# Patient Record
Sex: Male | Born: 1955 | Race: White | Hispanic: No | Marital: Married | Smoking: Former smoker
Health system: Southern US, Community
[De-identification: ages and names within clinical notes are randomized; demographics above are authoritative.]

## PROBLEM LIST (undated history)

## (undated) DIAGNOSIS — E039 Hypothyroidism, unspecified: Secondary | ICD-10-CM

## (undated) DIAGNOSIS — M109 Gout, unspecified: Secondary | ICD-10-CM

## (undated) DIAGNOSIS — E785 Hyperlipidemia, unspecified: Secondary | ICD-10-CM

## (undated) DIAGNOSIS — I10 Essential (primary) hypertension: Secondary | ICD-10-CM

## (undated) DIAGNOSIS — G4730 Sleep apnea, unspecified: Secondary | ICD-10-CM

## (undated) DIAGNOSIS — M720 Palmar fascial fibromatosis [Dupuytren]: Secondary | ICD-10-CM

## (undated) HISTORY — DX: Essential (primary) hypertension: I10

## (undated) HISTORY — DX: Hyperlipidemia, unspecified: E78.5

## (undated) HISTORY — DX: Hypothyroidism, unspecified: E03.9

## (undated) HISTORY — PX: COLONOSCOPY: SHX174

## (undated) HISTORY — DX: Gout, unspecified: M10.9

---

## 2009-07-10 ENCOUNTER — Encounter: Admission: RE | Admit: 2009-07-10 | Discharge: 2009-07-10 | Payer: Self-pay | Admitting: Family Medicine

## 2010-04-12 ENCOUNTER — Encounter: Payer: Self-pay | Admitting: Family Medicine

## 2013-06-14 ENCOUNTER — Other Ambulatory Visit: Payer: Self-pay | Admitting: Family Medicine

## 2013-06-14 DIAGNOSIS — M542 Cervicalgia: Secondary | ICD-10-CM

## 2013-06-17 ENCOUNTER — Ambulatory Visit
Admission: RE | Admit: 2013-06-17 | Discharge: 2013-06-17 | Disposition: A | Discharge status: Home / Self Care | Payer: 59 | Source: Ambulatory Visit | Attending: Family Medicine | Admitting: Family Medicine

## 2013-06-17 DIAGNOSIS — M542 Cervicalgia: Secondary | ICD-10-CM

## 2013-07-23 ENCOUNTER — Other Ambulatory Visit: Payer: Self-pay | Admitting: Family Medicine

## 2013-07-23 DIAGNOSIS — I10 Essential (primary) hypertension: Secondary | ICD-10-CM

## 2013-07-23 DIAGNOSIS — R0989 Other specified symptoms and signs involving the circulatory and respiratory systems: Secondary | ICD-10-CM

## 2013-07-25 ENCOUNTER — Ambulatory Visit
Admission: RE | Admit: 2013-07-25 | Discharge: 2013-07-25 | Disposition: A | Discharge status: Home / Self Care | Payer: 59 | Source: Ambulatory Visit | Attending: Family Medicine | Admitting: Family Medicine

## 2013-07-25 ENCOUNTER — Encounter (INDEPENDENT_AMBULATORY_CARE_PROVIDER_SITE_OTHER): Payer: Self-pay

## 2013-07-25 DIAGNOSIS — I10 Essential (primary) hypertension: Secondary | ICD-10-CM

## 2013-07-25 DIAGNOSIS — R0989 Other specified symptoms and signs involving the circulatory and respiratory systems: Secondary | ICD-10-CM

## 2014-12-26 ENCOUNTER — Other Ambulatory Visit: Payer: Self-pay | Admitting: Family Medicine

## 2014-12-26 DIAGNOSIS — I6523 Occlusion and stenosis of bilateral carotid arteries: Secondary | ICD-10-CM

## 2015-01-01 ENCOUNTER — Ambulatory Visit
Admission: RE | Admit: 2015-01-01 | Discharge: 2015-01-01 | Disposition: A | Discharge status: Home / Self Care | Payer: 59 | Source: Ambulatory Visit | Attending: Family Medicine | Admitting: Family Medicine

## 2015-01-01 DIAGNOSIS — I6523 Occlusion and stenosis of bilateral carotid arteries: Secondary | ICD-10-CM

## 2016-01-16 ENCOUNTER — Other Ambulatory Visit: Payer: Self-pay | Admitting: Neurosurgery

## 2016-01-16 DIAGNOSIS — M5416 Radiculopathy, lumbar region: Secondary | ICD-10-CM

## 2016-01-27 ENCOUNTER — Ambulatory Visit
Admission: RE | Admit: 2016-01-27 | Discharge: 2016-01-27 | Disposition: A | Discharge status: Home / Self Care | Payer: 59 | Source: Ambulatory Visit | Attending: Neurosurgery | Admitting: Neurosurgery

## 2016-01-27 DIAGNOSIS — M5416 Radiculopathy, lumbar region: Secondary | ICD-10-CM

## 2016-05-20 ENCOUNTER — Other Ambulatory Visit: Payer: Self-pay | Admitting: Family Medicine

## 2016-05-20 DIAGNOSIS — I6529 Occlusion and stenosis of unspecified carotid artery: Secondary | ICD-10-CM

## 2016-05-24 ENCOUNTER — Ambulatory Visit
Admission: RE | Admit: 2016-05-24 | Discharge: 2016-05-24 | Disposition: A | Discharge status: Home / Self Care | Payer: Commercial Managed Care - HMO | Source: Ambulatory Visit | Attending: Family Medicine | Admitting: Family Medicine

## 2016-05-24 DIAGNOSIS — I6529 Occlusion and stenosis of unspecified carotid artery: Secondary | ICD-10-CM

## 2016-07-20 ENCOUNTER — Other Ambulatory Visit: Payer: Self-pay | Admitting: Family Medicine

## 2016-07-20 ENCOUNTER — Ambulatory Visit
Admission: RE | Admit: 2016-07-20 | Discharge: 2016-07-20 | Disposition: A | Discharge status: Home / Self Care | Payer: Commercial Managed Care - HMO | Source: Ambulatory Visit | Attending: Family Medicine | Admitting: Family Medicine

## 2016-07-20 DIAGNOSIS — J181 Lobar pneumonia, unspecified organism: Secondary | ICD-10-CM

## 2016-07-27 ENCOUNTER — Other Ambulatory Visit: Payer: Self-pay | Admitting: Family Medicine

## 2016-07-29 ENCOUNTER — Other Ambulatory Visit: Payer: Self-pay | Admitting: Family Medicine

## 2016-07-29 DIAGNOSIS — A156 Tuberculous pleurisy: Principal | ICD-10-CM

## 2016-07-29 DIAGNOSIS — A157 Primary respiratory tuberculosis: Secondary | ICD-10-CM

## 2016-07-30 ENCOUNTER — Ambulatory Visit
Admission: RE | Admit: 2016-07-30 | Discharge: 2016-07-30 | Disposition: A | Discharge status: Home / Self Care | Payer: Commercial Managed Care - HMO | Source: Ambulatory Visit | Attending: Family Medicine | Admitting: Family Medicine

## 2016-07-30 DIAGNOSIS — A157 Primary respiratory tuberculosis: Secondary | ICD-10-CM

## 2016-07-30 DIAGNOSIS — A156 Tuberculous pleurisy: Principal | ICD-10-CM

## 2016-07-30 MED ORDER — IOPAMIDOL (ISOVUE-300) INJECTION 61%
75.0000 mL | Freq: Once | INTRAVENOUS | Status: AC | PRN
  Administered 2016-07-30: 75 mL via INTRAVENOUS

## 2017-01-02 ENCOUNTER — Encounter: Payer: Self-pay | Admitting: Neurology

## 2017-01-03 ENCOUNTER — Ambulatory Visit (INDEPENDENT_AMBULATORY_CARE_PROVIDER_SITE_OTHER): Payer: 59 | Admitting: Neurology

## 2017-01-03 ENCOUNTER — Encounter: Payer: Self-pay | Admitting: Neurology

## 2017-01-03 ENCOUNTER — Encounter (INDEPENDENT_AMBULATORY_CARE_PROVIDER_SITE_OTHER): Payer: Self-pay

## 2017-01-03 VITALS — BP 110/70 | HR 66 | Ht 68.0 in | Wt 195.0 lb

## 2017-01-03 DIAGNOSIS — I10 Essential (primary) hypertension: Secondary | ICD-10-CM

## 2017-01-03 DIAGNOSIS — R351 Nocturia: Secondary | ICD-10-CM | POA: Diagnosis not present

## 2017-01-03 DIAGNOSIS — G4730 Sleep apnea, unspecified: Secondary | ICD-10-CM

## 2017-01-03 DIAGNOSIS — R0683 Snoring: Secondary | ICD-10-CM

## 2017-01-03 NOTE — Progress Notes (Signed)
SLEEP MEDICINE CLINIC   Provider:  Melvyn Rojas, M D  Primary Care Physician:  Nathaniel Puffer, MD   Referring Provider: Aida Puffer, MD   Chief Complaint  Patient presents with  . New Patient (Initial Visit)    pt alone, rm 10, pt has been told that he stops breathing and snores in sleep. pt is tired during day and states that he wakes up during the night often    HPI:  Nathaniel Rojas is a 61 y.o. male , seen here as in a referral from Dr. Aida Rojas  of [place]. Nathaniel Rojas reports that his wife has told him that he snores, but he usually sleeps through. His wife has moved out of the marital bedroom. He endorsed nocturia 2-3 times each night. More after drinking beer.   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 Radiculopathy treated at the neurosurgical group by Dr. Ollen Rojas with epidural steroid injections. He is treated for hypertension with amlodipine, bisoprolol, Tums was seen, takes atorvastatin, and levothyroxine. He has a history of a carotid stenosis by ultrasound less than 50% stenosis on the proximal ICA bilaterally , at th bifurcation and origin -there is a bruit at the right external carotid artery. he had a stress test in 2017 with normal results, Dr. Clarene Rojas has referred him back to neurosurgery, since Dr. Jeral Rojas retired. The patient is here today strictly for sleep.  Sleep habits are as follows: Patient usually watches TV for the last hour before bedtime, and bedtime is on average about 10 PM. He falls asleep promptly, his bedroom is cool, quiet and dark, he now sleeps alone. He sleeps usually on his side, and uses a firm pillow for neck support. He has to get up about every 3 hours during the night to urinate, 3 times, he has no trouble falling back asleep. He reports dreams, but not every night- not describing nightmares.  He does have tingling numbness in the right leg attributed to his degenerative disc disease. No  restless legs endorsed. He has  been told that he stops breathing at night, worst when in supine. He wakes up spontaneously between 6 AM and 6:30 in the morning ready to go. He often wakes up tired but he does not use the snooze button.   Sleep medical history and family sleep history: He does not know of any family member with obstructive sleep apnea or suspected to have it. The patient did not have night terrors, parasomnias or enuresis in childhood. He did not undergo tonsillectomy but adenoidectomy in childhood.  history of septal deviation,  he has developed neck pain. HTN, hypothyroid disease.    Social history:  Married, 26 and 72 year old children, former smoker - quit Nov 21. 2011- on nictotrol patches, drinks mostly beer 4-8 a week, some hard liquor.  Caffeine , iced tea ( 1 a day) and SODA, 1 -2 daily. 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    Review of Systems: Out of a complete 14 system review, the patient complains of only the following symptoms, and all other reviewed systems are negative. Nathaniel Rojas has never felt that she snores or has apnea and relies this is all on second hand information, witnessed by his wife and children. He does not feel excessively daytime sleepy and endorsed the Epworth sleepiness score at 7 points only, below average. He also did not endorse a high level of daytime fatigue with  an FSS at 22 points. He has no history of Lyme disease, no history of depression or anxiety.   Social History   Social History  . Marital status: Married    Spouse name: N/A  . Number of children: N/A  . Years of education: N/A   Occupational History  . Not on file.   Social History Main Topics  . Smoking status: Former Smoker    Types: Cigarettes    Quit date: 2001  . Smokeless tobacco: Never Used  . Alcohol use 1.2 oz/week    2 Cans of beer per week  . Drug use: No  . Sexual activity: Not on file   Other Topics Concern  . Not on file   Social History  Narrative  . No narrative on file    No family history on file.  Past Medical History:  Diagnosis Date  . Gout   . Hyperlipidemia   . Hypertension   . Hypothyroid     No past surgical history on file.  Current Outpatient Prescriptions  Medication Sig Dispense Refill  . allopurinol (ZYLOPRIM) 300 MG tablet Take 300 mg by mouth daily.    Marland Kitchen amLODipine (NORVASC) 10 MG tablet Take 10 mg by mouth daily.    Marland Kitchen atorvastatin (LIPITOR) 10 MG tablet Take 10 mg by mouth daily.    . bisoprolol (ZEBETA) 5 MG tablet Take 5 mg by mouth daily.    Marland Kitchen DOXYCYCLINE PO Take 100 mg by mouth 2 (two) times daily.    Marland Kitchen levothyroxine (SYNTHROID, LEVOTHROID) 100 MCG tablet Take 100 mcg by mouth daily before breakfast.    . tamsulosin (FLOMAX) 0.4 MG CAPS capsule Take 0.4 mg by mouth.     No current facility-administered medications for this visit.     Allergies as of 01/03/2017 - Review Complete 01/03/2017  Allergen Reaction Noted  . Niacin and related  01/02/2017    Vitals: BP 110/70   Pulse 66   Ht  (1.727 m)   Wt 195 lb (88.5 kg)   BMI 29.65 kg/m  Last Weight:  Wt Readings from Last 1 Encounters:  01/03/17 195 lb (88.5 kg)   AOZ:HYQM mass index is 29.65 kg/m.     Last Height:   Ht Readings from Last 1 Encounters:  01/03/17  (1.727 m)    Physical exam:  General: The patient is awake, alert and appears not in acute distress. The patient is well groomed. Head: Normocephalic, atraumatic. Neck is supple. Mallampati 5- uvula not visible and very short upper airway.   neck circumference 17.5 . Nasal airflow patent , TMJ is evident. Retrognathia is seen.  Cardiovascular:  Regular rate and rhythm, without  murmurs or carotid bruit, and without distended neck veins. Respiratory: Lungs are clear to auscultation. Skin:  Without evidence of edema, or rash Trunk: BMI is 30. The patient's posture is affected by right leg pain.   Neurologic exam : The patient is awake and alert, oriented  to place and time.   Attention span & concentration ability appears normal. Speech is fluent, without dysarthria, dysphonia or aphasia. Mood and affect are appropriate.  Cranial nerves:Pupils are equal and briskly reactive to light. Funduscopic exam without evidence of pallor or edema. Extraocular movements in vertical and horizontal planes intact and without nystagmus. Visual fields by finger perimetry are intact.Hearing to finger rub intact. Facial sensation intact to fine touch. Facial motor strength is symmetric and tongue and uvula move midline. Shoulder shrug was symmetrical.  Motor exam:  " Right leg numbness and weakness"- limp when in pain.  Sensory:  Fine touch, pinprick and vibration were tested in all extremities =burning sensation lateral thigh and down to knee on right leg, both feet burning pain. Proprioception tested in the upper extremities was normal. He feels his right leg is heavier and weaker.  Coordination: Rapid alternating movements in the fingers/hands was normal. Finger-to-nose maneuver  normal without evidence of ataxia, dysmetria or tremor. Gait and station: Patient walks without assistive device and is able unassisted to climb up to the exam table. Strength within normal limits.Stance is stable and normal.  Toe and hell stand were tested .Tandem gait is unfragmented. Turns with  3  Steps. Romberg testing is negative.  Deep tendon reflexes: in the upper and lower extremities are brisk and symmetric. Babinski maneuver response is downgoing.   Assessment:  After physical and neurologic examination, review of laboratory studies,  Personal review of imaging studies, reports of other /same  Imaging studies, results of polysomnography and / or neurophysiology testing and pre-existing records as far as provided in visit., my assessment is   1)  Patient with witnessed sleep apnea and snoring, loudly enough for his wife to leave the common bedroom.  Co-morbidities of HTN, obesity.  No DM, thyroid disease, but gout. Nocturia and lack of energy can both be related to OSA.   The patient was advised of the nature of the diagnosed disorder , the treatment options and the  risks for general health and wellness arising from not treating the condition.   2) I will order a SPLIT night study, if denied try for HST.   I spent more than 45 minutes of face to face time with the patient.  Greater than 50% of time was spent in counseling and coordination of care. We have discussed the diagnosis and differential and I answered the patient's questions.    Plan:  Treatment plan and additional workup : RV after sleep study   Nathaniel Novas, MD 01/03/2017, 1:19 PM  Certified in Neurology by ABPN Certified in Sleep Medicine by [facility] [address], [address] [place], [place] [address]

## 2017-01-03 NOTE — Patient Instructions (Signed)

## 2017-01-19 ENCOUNTER — Ambulatory Visit (INDEPENDENT_AMBULATORY_CARE_PROVIDER_SITE_OTHER): Payer: 59 | Admitting: Neurology

## 2017-01-19 DIAGNOSIS — I10 Essential (primary) hypertension: Secondary | ICD-10-CM

## 2017-01-19 DIAGNOSIS — G4733 Obstructive sleep apnea (adult) (pediatric): Secondary | ICD-10-CM

## 2017-01-19 DIAGNOSIS — R0683 Snoring: Secondary | ICD-10-CM

## 2017-01-19 DIAGNOSIS — R351 Nocturia: Secondary | ICD-10-CM

## 2017-01-19 DIAGNOSIS — G4730 Sleep apnea, unspecified: Secondary | ICD-10-CM

## 2017-01-21 ENCOUNTER — Telehealth: Payer: Self-pay | Admitting: Neurology

## 2017-01-21 DIAGNOSIS — R0683 Snoring: Secondary | ICD-10-CM

## 2017-01-21 DIAGNOSIS — R351 Nocturia: Secondary | ICD-10-CM

## 2017-01-21 NOTE — Procedures (Signed)
[facility] [address] [place], [place] [address] NAME:    Nathaniel Rojas                         DOB: 03-29-1955 MEDICAL RECORD No: [id number]                DOS: 01/19/2017 REFERRING PHYSICIAN: Aida PufferJames Little, M.D. STUDY PERFORMED: Home Sleep Study HISTORY: This 61 year old male patient of Dr. Fredirick MaudlinLittle's presents with his wife complaints about his snoring.        He endorsed nocturia 2-3 times each night- Nocturia and lack of energy. Co-morbidities of HTN, obesity, gout.  Epworth Sleepiness Score at 7 points, FSS at 22 points, BMI is 29.  STUDY RESULTS: Total Recording:  7 hours, 32 minutes Total Apnea/Hypopnea Index (AHI):  10.2/hour and RDI was 14.7/hr.  Average Oxygen Saturation: 92%; Lowest Oxygen Saturation: 87 %  Time Oxygen Saturation Below 89%: 6 minutes, 1% of recorded time. Average Heart Rate: 63 bpm, 53 to 87 bpm.  IMPRESSION: Very mild obstructive sleep apnea, no associated hypoxemia or arrhythmia.   RECOMMENDATION: OSA of this mild degree does not need to be treated by interventional means. Weight loss is likely sufficient in controlling apnea, and would reduce snoring.  If snoring remains a main concern, I recommend treatment by a dental device. We are able to make this referral if patient desires.    I certify that I have reviewed the raw data recording prior to the issuance of this report in accordance with the standards of the American Academy of Sleep Medicine (AASM). Melvyn Novasarmen Jesscia Imm, M.D.                01-21-2017  Medical Director of [facility]; Diplomat of the ABPN and ABSM, and accredited by AASM Neurologist and Sleep Medicine Specialist

## 2017-01-24 ENCOUNTER — Telehealth: Payer: Self-pay | Admitting: Neurology

## 2017-01-24 NOTE — Telephone Encounter (Signed)
-----   Message from Melvyn Novasarmen Dohmeier, MD sent at 01/21/2017  1:38 PM EDT ----- I will be happy to meet with Nathaniel Rojas if requested. He can also see [name]-  Based on HST  he would not need CPAP. Rec. 1) Weight loss and 2)possibly a dental device for snoring treatment ( if patient wants this) .  Send cc to Dr Clarene DukeLittle, please !  CD

## 2017-01-24 NOTE — Telephone Encounter (Signed)
Called the patient and went over the HST. The HST showed mild apnea with no complications of low oxygen or cardiac arrhythmia's. Dr Dohmeier recommends weight loss to help treat with mild apnea and snoring. She states that if snoring is a main concern we could placed a dental referral for a dental device that would also help with treatment. Pt declines referal at this time. Will call back if chooses to do so. Pt verbalized understanding. Pt had no questions at this time but was encouraged to call back if questions arise.

## 2017-03-18 NOTE — Telephone Encounter (Signed)
HST 

## 2017-08-03 ENCOUNTER — Other Ambulatory Visit: Payer: Self-pay | Admitting: Neurosurgery

## 2017-08-03 DIAGNOSIS — M5412 Radiculopathy, cervical region: Secondary | ICD-10-CM

## 2017-08-04 ENCOUNTER — Ambulatory Visit
Admission: RE | Admit: 2017-08-04 | Discharge: 2017-08-04 | Disposition: A | Discharge status: Home / Self Care | Payer: 59 | Source: Ambulatory Visit | Attending: Neurosurgery | Admitting: Neurosurgery

## 2017-08-04 DIAGNOSIS — M5412 Radiculopathy, cervical region: Secondary | ICD-10-CM

## 2017-09-07 ENCOUNTER — Other Ambulatory Visit: Payer: Self-pay | Admitting: Neurosurgery

## 2017-09-28 ENCOUNTER — Other Ambulatory Visit (HOSPITAL_COMMUNITY): Payer: 59

## 2017-09-28 NOTE — Pre-Procedure Instructions (Signed)
Nathaniel Rojas  09/28/2017      [facility] - [place] ([place]), [place] - [address] [place] [address] Phone: [phone] Fax: [phone]    Your procedure is scheduled on July 19  [facility] at [phone] A.M.  Call this number if you have problems the morning of surgery:  [phone]   Remember:  Do not eat or drink after midnight.      Take these medicines the morning of surgery with A SIP OF WATER  allopurinol (ZYLOPRIM) amLODipine (NORVASC) bisoprolol (ZEBETA) HYDROcodone-acetaminophen (NORCO/VICODIN)  levothyroxine (SYNTHROID, LEVOTHROID) tamsulosin (FLOMAX)   7 days prior to surgery STOP taking any Aspirin(unless otherwise instructed by your surgeon), Aleve, Naproxen, Ibuprofen, Motrin, Advil, Goody's, BC's, all herbal medications, fish oil, and all vitamins, meloxicam (MOBIC    Do not wear jewelry  Do not wear lotions, powders, or cologne, or deodorant.  Men may shave face and neck.  Do not bring valuables to the hospital.  [facility] is not responsible for any belongings or valuables.  Contacts, dentures or bridgework may not be worn into surgery.  Leave your suitcase in the car.  After surgery it may be brought to your room.  For patients admitted to the hospital, discharge time will be determined by your treatment team.  Patients discharged the day of surgery will not be allowed to drive home.    Special instructions:   [place]- Preparing For Surgery  Before surgery, you can play an important role. Because skin is not sterile, your skin needs to be as free of germs as possible. You can reduce the number of germs on your skin by washing with CHG (chlorahexidine gluconate) Soap before surgery.  CHG is an antiseptic cleaner which kills germs and bonds with the skin to continue killing germs even after washing.    Oral Hygiene is also important to reduce your risk of infection.   Remember - BRUSH YOUR TEETH THE MORNING OF SURGERY WITH YOUR REGULAR TOOTHPASTE  Please do not use if you have an allergy to CHG or antibacterial soaps. If your skin becomes reddened/irritated stop using the CHG.  Do not shave (including legs and underarms) for at least 48 hours prior to first CHG shower. It is OK to shave your face.  Please follow these instructions carefully.   1. Shower the NIGHT BEFORE SURGERY and the MORNING OF SURGERY with CHG.   2. If you chose to wash your hair, wash your hair first as usual with your normal shampoo.  3. After you shampoo, rinse your hair and body thoroughly to remove the shampoo.  4. Use CHG as you would any other liquid soap. You can apply CHG directly to the skin and wash gently with a scrungie or a clean washcloth.   5. Apply the CHG Soap to your body ONLY FROM THE NECK DOWN.  Do not use on open wounds or open sores. Avoid contact with your eyes, ears, mouth and genitals (private parts). Wash Face and genitals (private parts)  with your normal soap.  6. Wash thoroughly, paying special attention to the area where your surgery will be performed.  7. Thoroughly rinse your body with warm water from the neck down.  8. DO NOT shower/wash with your normal soap after using and rinsing off the CHG Soap.  9. Pat yourself dry with a CLEAN TOWEL.  10. Wear CLEAN PAJAMAS to bed the night before surgery,  wear comfortable clothes the morning of surgery  11. Place CLEAN SHEETS on your bed the night of your first shower and DO NOT SLEEP WITH PETS.    Day of Surgery:  Do not apply any deodorants/lotions.  Please wear clean clothes to the hospital/surgery center.   Remember to brush your teeth WITH YOUR REGULAR TOOTHPASTE.    Please read over the following fact sheets that you were given.

## 2017-09-29 ENCOUNTER — Encounter (HOSPITAL_COMMUNITY)
Admission: RE | Admit: 2017-09-29 | Discharge: 2017-09-29 | Disposition: A | Discharge status: Home / Self Care | Payer: 59 | Source: Ambulatory Visit | Attending: Neurosurgery | Admitting: Neurosurgery

## 2017-09-29 ENCOUNTER — Encounter (HOSPITAL_COMMUNITY): Payer: Self-pay

## 2017-09-29 ENCOUNTER — Other Ambulatory Visit: Payer: Self-pay

## 2017-09-29 DIAGNOSIS — I440 Atrioventricular block, first degree: Secondary | ICD-10-CM | POA: Diagnosis not present

## 2017-09-29 DIAGNOSIS — Z01818 Encounter for other preprocedural examination: Secondary | ICD-10-CM | POA: Diagnosis present

## 2017-09-29 DIAGNOSIS — I10 Essential (primary) hypertension: Secondary | ICD-10-CM | POA: Diagnosis not present

## 2017-09-29 LAB — TYPE AND SCREEN
ABO/RH(D): A NEG
Antibody Screen: NEGATIVE

## 2017-09-29 LAB — BASIC METABOLIC PANEL
Anion gap: 10 (ref 5–15)
BUN: 19 mg/dL (ref 6–20)
CHLORIDE: 111 mmol/L (ref 98–111)
CO2: 22 mmol/L (ref 22–32)
CREATININE: 1.18 mg/dL (ref 0.61–1.24)
Calcium: 9.2 mg/dL (ref 8.9–10.3)
GFR calc Af Amer: >60 mL/min (ref >60)
GFR calc non Af Amer: >60 mL/min (ref >60)
Glucose, Bld: 107 mg/dL — ABNORMAL HIGH (ref 70–99)
Potassium: 4 mmol/L (ref 3.5–5.1)
SODIUM: 143 mmol/L (ref 135–145)

## 2017-09-29 LAB — CBC
HCT: 41.9 % (ref 39.0–52.0)
HEMOGLOBIN: 14.8 g/dL (ref 13.0–17.0)
MCH: 32.5 pg (ref 26.0–34.0)
MCHC: 35.3 g/dL (ref 30.0–36.0)
MCV: 92.1 fL (ref 78.0–100.0)
PLATELETS: 179 10*3/uL (ref 150–400)
RBC: 4.55 MIL/uL (ref 4.22–5.81)
RDW: 12.8 % (ref 11.5–15.5)
WBC: 8.1 10*3/uL (ref 4.0–10.5)

## 2017-09-29 LAB — SURGICAL PCR SCREEN
MRSA, PCR: NEGATIVE
Staphylococcus aureus: POSITIVE — AB

## 2017-09-29 LAB — ABO/RH: ABO/RH(D): A NEG

## 2017-09-29 NOTE — Progress Notes (Signed)
   09/29/17 0911  OBSTRUCTIVE SLEEP APNEA  Have you ever been diagnosed with sleep apnea through a sleep study? No  Do you snore loudly (loud enough to be heard through closed doors)?  1  Do you often feel tired, fatigued, or sleepy during the daytime (such as falling asleep during driving or talking to someone)? 0  Has anyone observed you stop breathing during your sleep? 0  Do you have, or are you being treated for high blood pressure? 1  BMI more than 35 kg/m2? 0  Age > 50 (1-yes) 1  Neck circumference greater than:Male 16 inches or larger, Male 17inches or larger? 1  Male Gender (Yes=1) 1  Obstructive Sleep Apnea Score 5  Score 5 or greater  Results sent to PCP

## 2017-09-29 NOTE — Progress Notes (Signed)
Requested stress test, last ov ,?ekg/echo if done from Dr .Verl DickerGanji's office.

## 2017-09-30 NOTE — Progress Notes (Signed)
Anesthesia Chart Review:  Case:  [id number] Date/Time:  10/07/17 1000   Procedure:  ANTERIOR CERVICAL DECOMPRESSION/DISCECTOMY FUSION CERVICAL 4- CERVICAL 5, CERVICAL 5- CERVICAL 6, CERVICAL 6- CERVICAL 7 (N/A ) - ANTERIOR CERVICAL DECOMPRESSION/DISCECTOMY FUSION CERVICAL 4- CERVICAL 5, CERVICAL 5- CERVICAL 6, CERVICAL 6- CERVICAL 7   Anesthesia type:  General   Pre-op diagnosis:  CERVICAL DISC DISEASE WITH MYELOPATHY   Location:  [facility] OR ROOM 54 / [facility]   Surgeon:  Consuella Lose, MD      DISCUSSION: 62 yo male former smoker scheduled for above procedure. Past medical hx significant for HTN, hypothyroid, gout.   PAT OSA screening score of 5, result was sent to pt's PCP. Sleep study from 2018 revealed very mild OSA.  Pt has stress test in 2016 with no ischemic changes and 9.61 METS achieved. See below for full report.  Anticipate can proceed with surgery as planned barring acute status change.  VS: BP 138/66   Pulse 66   Temp 36.7 C   Resp 20   Ht '5\' 8"'  (1.727 m)   Wt 194 lb 1.6 oz (88 kg)   SpO2 95%   BMI 29.51 kg/m   PROVIDERS: Tamsen Roers, MD is PCP  Kela Millin is Cardiologist  LABS: Labs reviewed: Acceptable for surgery. (all labs ordered are listed, but only abnormal results are displayed)  Labs Reviewed  SURGICAL PCR SCREEN - Abnormal; Notable for the following components:      Result Value   Staphylococcus aureus POSITIVE (*)    All other components within normal limits  BASIC METABOLIC PANEL - Abnormal; Notable for the following components:   Glucose, Bld 107 (*)    All other components within normal limits  CBC  TYPE AND SCREEN  ABO/RH    IMAGES: CT Chest 07/30/2016 Cardiovascular: There is no thoracic aortic aneurysm or dissection. There are scattered foci of atherosclerotic calcification in the proximal left subclavian artery. Other visualized portions of the great vessels appear normal. There are scattered foci of atherosclerotic calcification  in the aorta. There is no appreciable pericardial thickening. There is no major vessel pulmonary embolus.  Mediastinum/Nodes: Thyroid appears somewhat diminutive but normal. There is no appreciable thoracic adenopathy.  Lungs/Pleura: There is mild bibasilar atelectasis. No edema or consolidation. There is no evidence of cavitation or pleural thickening. No parenchymal lung mass or pleural effusion.  Upper Abdomen: There is atherosclerotic calcification in the upper abdominal aorta. Visualized upper abdominal structures otherwise appear unremarkable.  Musculoskeletal: There are no blastic or lytic bone lesions.  IMPRESSION: Bibasilar atelectasis. No edema or consolidation. No cavitary lesion or mass. No pleural thickening or pleural effusion.  No adenopathy.  Scattered atherosclerotic calcifications.  EKG: 09/29/2017: NSR with 1st degree block. Early repolarization.    CV: Carotid US 05/24/2016 IMPRESSION: Mild to moderate atherosclerotic disease at the right carotid bulb and involving the proximal right internal and external arteries. This disease has not significantly changed from the previous examination. Estimated degree of stenosis in the right internal carotid artery is less than 50%.  Right external carotid artery stenosis is similar to the previous examination.  Estimated degree of stenosis in the left internal carotid artery is less than 50%.  Patent vertebral arteries with antegrade flow.   Stress test 12/30/2014 The patient achieved max heart rate of 145 with 88% of MPHR for age and 9.61 METS of work.  Normal BP response.  Resting ECG shows NSR.  There was no ST-T changes of ischemia with exercise  stress test.  No arrhythmias noted.  Stress terminated due to right leg pain and target heart rate > 85% MPHR met  Past Medical History:  Diagnosis Date  . Gout   . Hyperlipidemia   . Hypertension   . Hypothyroid     Past Surgical History:   Procedure Laterality Date  . COLONOSCOPY    . NO PAST SURGERIES      MEDICATIONS: . allopurinol (ZYLOPRIM) 300 MG tablet  . amLODipine (NORVASC) 10 MG tablet  . aspirin EC 81 MG tablet  . atorvastatin (LIPITOR) 10 MG tablet  . bisoprolol (ZEBETA) 5 MG tablet  . HYDROcodone-acetaminophen (NORCO/VICODIN) 5-325 MG tablet  . levothyroxine (SYNTHROID, LEVOTHROID) 100 MCG tablet  . meloxicam (MOBIC) 7.5 MG tablet  . Multiple Vitamins-Minerals (MULTIVITAMIN WITH MINERALS) tablet  . Omega-3 Fatty Acids (FISH OIL) 1000 MG CAPS  . tamsulosin (FLOMAX) 0.4 MG CAPS capsule   No current facility-administered medications for this encounter.    Wynonia Musty [facility]/Anesthesiology Phone [phone] 10/03/2017 11:25 AM

## 2017-10-03 ENCOUNTER — Other Ambulatory Visit: Payer: Self-pay | Admitting: Neurosurgery

## 2017-10-07 ENCOUNTER — Inpatient Hospital Stay (HOSPITAL_COMMUNITY): Payer: 59

## 2017-10-07 ENCOUNTER — Other Ambulatory Visit: Payer: Self-pay

## 2017-10-07 ENCOUNTER — Inpatient Hospital Stay (HOSPITAL_COMMUNITY): Payer: 59 | Admitting: Anesthesiology

## 2017-10-07 ENCOUNTER — Encounter (HOSPITAL_COMMUNITY): Payer: Self-pay | Admitting: *Deleted

## 2017-10-07 ENCOUNTER — Inpatient Hospital Stay (HOSPITAL_COMMUNITY)
Admission: RE | Disposition: A | Discharge status: Home / Self Care | Payer: Self-pay | Source: Home / Self Care | Attending: Neurosurgery

## 2017-10-07 ENCOUNTER — Inpatient Hospital Stay (HOSPITAL_COMMUNITY)
Admission: RE | Admit: 2017-10-07 | Discharge: 2017-10-08 | DRG: 472 | Disposition: A | Discharge status: Home / Self Care | Payer: 59 | Attending: Neurosurgery | Admitting: Neurosurgery

## 2017-10-07 ENCOUNTER — Inpatient Hospital Stay (HOSPITAL_COMMUNITY): Payer: 59 | Admitting: Physician Assistant

## 2017-10-07 DIAGNOSIS — F17200 Nicotine dependence, unspecified, uncomplicated: Secondary | ICD-10-CM | POA: Diagnosis present

## 2017-10-07 DIAGNOSIS — M4802 Spinal stenosis, cervical region: Principal | ICD-10-CM | POA: Diagnosis present

## 2017-10-07 DIAGNOSIS — Z791 Long term (current) use of non-steroidal anti-inflammatories (NSAID): Secondary | ICD-10-CM | POA: Diagnosis not present

## 2017-10-07 DIAGNOSIS — Z79890 Hormone replacement therapy (postmenopausal): Secondary | ICD-10-CM | POA: Diagnosis not present

## 2017-10-07 DIAGNOSIS — Z888 Allergy status to other drugs, medicaments and biological substances status: Secondary | ICD-10-CM | POA: Diagnosis not present

## 2017-10-07 DIAGNOSIS — Z7982 Long term (current) use of aspirin: Secondary | ICD-10-CM | POA: Diagnosis not present

## 2017-10-07 DIAGNOSIS — Z419 Encounter for procedure for purposes other than remedying health state, unspecified: Secondary | ICD-10-CM

## 2017-10-07 DIAGNOSIS — M4722 Other spondylosis with radiculopathy, cervical region: Secondary | ICD-10-CM | POA: Diagnosis present

## 2017-10-07 DIAGNOSIS — Z79899 Other long term (current) drug therapy: Secondary | ICD-10-CM

## 2017-10-07 DIAGNOSIS — E039 Hypothyroidism, unspecified: Secondary | ICD-10-CM | POA: Diagnosis present

## 2017-10-07 DIAGNOSIS — G992 Myelopathy in diseases classified elsewhere: Secondary | ICD-10-CM | POA: Diagnosis present

## 2017-10-07 DIAGNOSIS — E785 Hyperlipidemia, unspecified: Secondary | ICD-10-CM | POA: Diagnosis present

## 2017-10-07 DIAGNOSIS — I10 Essential (primary) hypertension: Secondary | ICD-10-CM | POA: Diagnosis present

## 2017-10-07 HISTORY — PX: ANTERIOR CERVICAL DECOMP/DISCECTOMY FUSION: SHX1161

## 2017-10-07 SURGERY — ANTERIOR CERVICAL DECOMPRESSION/DISCECTOMY FUSION 3 LEVELS
Anesthesia: General | Site: Spine Cervical

## 2017-10-07 MED ORDER — ACETAMINOPHEN 500 MG PO TABS
1000.0000 mg | ORAL_TABLET | Freq: Four times a day (QID) | ORAL | Status: AC
  Administered 2017-10-07 – 2017-10-08 (×3): 1000 mg via ORAL
  Filled 2017-10-07 (×3): qty 2

## 2017-10-07 MED ORDER — CEFAZOLIN SODIUM-DEXTROSE 2-4 GM/100ML-% IV SOLN
2.0000 g | INTRAVENOUS | Status: AC
  Administered 2017-10-07: 2 g via INTRAVENOUS
  Filled 2017-10-07: qty 100

## 2017-10-07 MED ORDER — BISACODYL 10 MG RE SUPP: 10.0000 mg | Freq: Every day | RECTAL | Status: DC | PRN

## 2017-10-07 MED ORDER — DIPHENHYDRAMINE HCL 50 MG/ML IJ SOLN
INTRAMUSCULAR | Status: DC | PRN
  Administered 2017-10-07: 12.5 mg via INTRAVENOUS

## 2017-10-07 MED ORDER — MENTHOL 3 MG MT LOZG
1.0000 | LOZENGE | OROMUCOSAL | Status: DC | PRN
Start: 2017-10-07 — End: 2017-10-08
  Filled 2017-10-07: qty 9

## 2017-10-07 MED ORDER — ROCURONIUM BROMIDE 10 MG/ML (PF) SYRINGE
PREFILLED_SYRINGE | INTRAVENOUS | Status: DC | PRN
  Administered 2017-10-07 (×4): 10 mg via INTRAVENOUS
  Administered 2017-10-07: 70 mg via INTRAVENOUS

## 2017-10-07 MED ORDER — SODIUM CHLORIDE 0.9 % IV SOLN: 250.0000 mL | INTRAVENOUS | Status: DC

## 2017-10-07 MED ORDER — ALBUMIN HUMAN 5 % IV SOLN
INTRAVENOUS | Status: DC | PRN
  Administered 2017-10-07 (×2): via INTRAVENOUS

## 2017-10-07 MED ORDER — CALCIUM CARBONATE ANTACID 500 MG PO CHEW: 1.0000 | CHEWABLE_TABLET | Freq: Once | ORAL | Status: DC

## 2017-10-07 MED ORDER — HYDROCODONE-ACETAMINOPHEN 5-325 MG PO TABS: 1.0000 | ORAL_TABLET | ORAL | Status: DC | PRN

## 2017-10-07 MED ORDER — SENNA 8.6 MG PO TABS
1.0000 | ORAL_TABLET | Freq: Two times a day (BID) | ORAL | Status: DC
  Administered 2017-10-07: 8.6 mg via ORAL
  Filled 2017-10-07: qty 1

## 2017-10-07 MED ORDER — METHOCARBAMOL 500 MG PO TABS
500.0000 mg | ORAL_TABLET | Freq: Four times a day (QID) | ORAL | Status: DC | PRN
  Administered 2017-10-07 – 2017-10-08 (×4): 500 mg via ORAL
  Filled 2017-10-07 (×3): qty 1

## 2017-10-07 MED ORDER — HYDROMORPHONE HCL 1 MG/ML IJ SOLN
0.5000 mg | INTRAMUSCULAR | Status: DC | PRN
  Administered 2017-10-07: 1 mg via INTRAVENOUS
  Filled 2017-10-07: qty 1

## 2017-10-07 MED ORDER — EPHEDRINE SULFATE 50 MG/ML IJ SOLN
INTRAMUSCULAR | Status: DC | PRN
  Administered 2017-10-07 (×2): 10 mg via INTRAVENOUS

## 2017-10-07 MED ORDER — SODIUM CHLORIDE 0.9% FLUSH: 3.0000 mL | Freq: Two times a day (BID) | INTRAVENOUS | Status: DC

## 2017-10-07 MED ORDER — LIDOCAINE-EPINEPHRINE 1 %-1:100000 IJ SOLN
INTRAMUSCULAR | Status: DC | PRN
  Administered 2017-10-07: 5 mL

## 2017-10-07 MED ORDER — SENNOSIDES-DOCUSATE SODIUM 8.6-50 MG PO TABS: 1.0000 | ORAL_TABLET | Freq: Every evening | ORAL | Status: DC | PRN

## 2017-10-07 MED ORDER — EPHEDRINE SULFATE 50 MG/ML IJ SOLN
INTRAMUSCULAR | Status: AC
  Filled 2017-10-07: qty 2

## 2017-10-07 MED ORDER — PROPOFOL 10 MG/ML IV BOLUS
INTRAVENOUS | Status: DC | PRN
  Administered 2017-10-07: 150 mg via INTRAVENOUS
  Administered 2017-10-07: 30 mg via INTRAVENOUS

## 2017-10-07 MED ORDER — PROMETHAZINE HCL 25 MG/ML IJ SOLN: 6.2500 mg | INTRAMUSCULAR | Status: DC | PRN

## 2017-10-07 MED ORDER — THROMBIN 20000 UNITS EX SOLR
CUTANEOUS | Status: DC | PRN
  Administered 2017-10-07: 20 mL via TOPICAL

## 2017-10-07 MED ORDER — ONDANSETRON HCL 4 MG/2ML IJ SOLN
INTRAMUSCULAR | Status: DC | PRN
  Administered 2017-10-07: 4 mg via INTRAVENOUS

## 2017-10-07 MED ORDER — PHENOL 1.4 % MT LIQD
1.0000 | OROMUCOSAL | Status: DC | PRN
  Administered 2017-10-07: 1 via OROMUCOSAL
  Filled 2017-10-07: qty 177

## 2017-10-07 MED ORDER — ONDANSETRON HCL 4 MG PO TABS: 4.0000 mg | ORAL_TABLET | Freq: Four times a day (QID) | ORAL | Status: DC | PRN

## 2017-10-07 MED ORDER — BUPIVACAINE HCL (PF) 0.5 % IJ SOLN
INTRAMUSCULAR | Status: AC
  Filled 2017-10-07: qty 30

## 2017-10-07 MED ORDER — ROCURONIUM BROMIDE 10 MG/ML (PF) SYRINGE
PREFILLED_SYRINGE | INTRAVENOUS | Status: AC
  Filled 2017-10-07: qty 10

## 2017-10-07 MED ORDER — PHENYLEPHRINE 40 MCG/ML (10ML) SYRINGE FOR IV PUSH (FOR BLOOD PRESSURE SUPPORT)
PREFILLED_SYRINGE | INTRAVENOUS | Status: DC | PRN
  Administered 2017-10-07: 80 ug via INTRAVENOUS
  Administered 2017-10-07 (×2): 120 ug via INTRAVENOUS

## 2017-10-07 MED ORDER — THROMBIN (RECOMBINANT) 20000 UNITS EX SOLR
CUTANEOUS | Status: AC
  Filled 2017-10-07: qty 20000

## 2017-10-07 MED ORDER — GABAPENTIN 300 MG PO CAPS
300.0000 mg | ORAL_CAPSULE | Freq: Three times a day (TID) | ORAL | Status: DC
  Administered 2017-10-07 – 2017-10-08 (×3): 300 mg via ORAL
  Filled 2017-10-07 (×3): qty 1

## 2017-10-07 MED ORDER — SUGAMMADEX SODIUM 200 MG/2ML IV SOLN
INTRAVENOUS | Status: DC | PRN
  Administered 2017-10-07: 200 mg via INTRAVENOUS

## 2017-10-07 MED ORDER — ACETAMINOPHEN 650 MG RE SUPP: 650.0000 mg | RECTAL | Status: DC | PRN

## 2017-10-07 MED ORDER — HYDROMORPHONE HCL 1 MG/ML IJ SOLN
INTRAMUSCULAR | Status: AC
  Administered 2017-10-07: 0.5 mg via INTRAVENOUS
  Filled 2017-10-07: qty 1

## 2017-10-07 MED ORDER — ALUM & MAG HYDROXIDE-SIMETH 200-200-20 MG/5ML PO SUSP
30.0000 mL | Freq: Once | ORAL | Status: AC
  Administered 2017-10-07: 30 mL via ORAL
  Filled 2017-10-07: qty 30

## 2017-10-07 MED ORDER — LACTATED RINGERS IV SOLN
INTRAVENOUS | Status: DC
  Administered 2017-10-07 (×3): via INTRAVENOUS

## 2017-10-07 MED ORDER — CHLORHEXIDINE GLUCONATE CLOTH 2 % EX PADS: 6.0000 | MEDICATED_PAD | Freq: Once | CUTANEOUS | Status: DC

## 2017-10-07 MED ORDER — FENTANYL CITRATE (PF) 100 MCG/2ML IJ SOLN
INTRAMUSCULAR | Status: DC | PRN
  Administered 2017-10-07 (×2): 50 ug via INTRAVENOUS
  Administered 2017-10-07: 100 ug via INTRAVENOUS
  Administered 2017-10-07: 50 ug via INTRAVENOUS

## 2017-10-07 MED ORDER — SODIUM CHLORIDE 0.9 % IV SOLN
INTRAVENOUS | Status: DC | PRN
  Administered 2017-10-07: 40 ug/min via INTRAVENOUS

## 2017-10-07 MED ORDER — MIDAZOLAM HCL 2 MG/2ML IJ SOLN
INTRAMUSCULAR | Status: AC
  Filled 2017-10-07: qty 2

## 2017-10-07 MED ORDER — SODIUM CHLORIDE 0.9 % IJ SOLN
INTRAMUSCULAR | Status: AC
  Filled 2017-10-07: qty 10

## 2017-10-07 MED ORDER — BUPIVACAINE HCL 0.5 % IJ SOLN
INTRAMUSCULAR | Status: DC | PRN
  Administered 2017-10-07: 5 mL

## 2017-10-07 MED ORDER — HYDROMORPHONE HCL 1 MG/ML IJ SOLN
0.2500 mg | INTRAMUSCULAR | Status: DC | PRN
  Administered 2017-10-07 (×4): 0.5 mg via INTRAVENOUS

## 2017-10-07 MED ORDER — METHOCARBAMOL 1000 MG/10ML IJ SOLN
500.0000 mg | Freq: Four times a day (QID) | INTRAVENOUS | Status: DC | PRN
  Filled 2017-10-07: qty 5

## 2017-10-07 MED ORDER — FLEET ENEMA 7-19 GM/118ML RE ENEM: 1.0000 | ENEMA | Freq: Once | RECTAL | Status: DC | PRN

## 2017-10-07 MED ORDER — DEXAMETHASONE SODIUM PHOSPHATE 10 MG/ML IJ SOLN
INTRAMUSCULAR | Status: DC | PRN
  Administered 2017-10-07: 10 mg via INTRAVENOUS

## 2017-10-07 MED ORDER — SODIUM CHLORIDE 0.9 % IV SOLN
INTRAVENOUS | Status: DC
  Administered 2017-10-07: 16:00:00 via INTRAVENOUS

## 2017-10-07 MED ORDER — 0.9 % SODIUM CHLORIDE (POUR BTL) OPTIME
TOPICAL | Status: DC | PRN
  Administered 2017-10-07: 1000 mL

## 2017-10-07 MED ORDER — FENTANYL CITRATE (PF) 250 MCG/5ML IJ SOLN
INTRAMUSCULAR | Status: AC
  Filled 2017-10-07: qty 5

## 2017-10-07 MED ORDER — MIDAZOLAM HCL 5 MG/5ML IJ SOLN
INTRAMUSCULAR | Status: DC | PRN
  Administered 2017-10-07: 2 mg via INTRAVENOUS

## 2017-10-07 MED ORDER — LIDOCAINE-EPINEPHRINE 1 %-1:100000 IJ SOLN
INTRAMUSCULAR | Status: AC
  Filled 2017-10-07: qty 1

## 2017-10-07 MED ORDER — SODIUM CHLORIDE 0.9 % IV SOLN
INTRAVENOUS | Status: DC | PRN
  Administered 2017-10-07: 500 mL

## 2017-10-07 MED ORDER — OXYCODONE HCL 5 MG PO TABS
5.0000 mg | ORAL_TABLET | ORAL | Status: DC | PRN
  Administered 2017-10-07: 5 mg via ORAL
  Administered 2017-10-07 – 2017-10-08 (×5): 10 mg via ORAL
  Filled 2017-10-07 (×5): qty 2

## 2017-10-07 MED ORDER — DOCUSATE SODIUM 100 MG PO CAPS
100.0000 mg | ORAL_CAPSULE | Freq: Two times a day (BID) | ORAL | Status: DC
  Administered 2017-10-07 – 2017-10-08 (×2): 100 mg via ORAL
  Filled 2017-10-07 (×2): qty 1

## 2017-10-07 MED ORDER — SODIUM CHLORIDE 0.9% FLUSH: 3.0000 mL | INTRAVENOUS | Status: DC | PRN

## 2017-10-07 MED ORDER — HEMOSTATIC AGENTS (NO CHARGE) OPTIME
TOPICAL | Status: DC | PRN
  Administered 2017-10-07 (×2): 1 via TOPICAL

## 2017-10-07 MED ORDER — HYDROMORPHONE HCL 1 MG/ML IJ SOLN: 0.2500 mg | INTRAMUSCULAR | Status: DC | PRN

## 2017-10-07 MED ORDER — ONDANSETRON HCL 4 MG/2ML IJ SOLN: 4.0000 mg | Freq: Four times a day (QID) | INTRAMUSCULAR | Status: DC | PRN

## 2017-10-07 MED ORDER — CEFAZOLIN SODIUM-DEXTROSE 2-4 GM/100ML-% IV SOLN
2.0000 g | Freq: Three times a day (TID) | INTRAVENOUS | Status: AC
  Administered 2017-10-07 – 2017-10-08 (×2): 2 g via INTRAVENOUS
  Filled 2017-10-07 (×2): qty 100

## 2017-10-07 MED ORDER — PROPOFOL 10 MG/ML IV BOLUS
INTRAVENOUS | Status: AC
  Filled 2017-10-07: qty 20

## 2017-10-07 MED ORDER — LIDOCAINE HCL (CARDIAC) PF 100 MG/5ML IV SOSY
PREFILLED_SYRINGE | INTRAVENOUS | Status: DC | PRN
  Administered 2017-10-07: 100 mg via INTRAVENOUS

## 2017-10-07 MED ORDER — OXYCODONE HCL 5 MG PO TABS
ORAL_TABLET | ORAL | Status: AC
  Administered 2017-10-07: 5 mg via ORAL
  Filled 2017-10-07: qty 1

## 2017-10-07 MED ORDER — ACETAMINOPHEN 325 MG PO TABS: 650.0000 mg | ORAL_TABLET | ORAL | Status: DC | PRN

## 2017-10-07 MED ORDER — METHOCARBAMOL 500 MG PO TABS
ORAL_TABLET | ORAL | Status: AC
  Filled 2017-10-07: qty 1

## 2017-10-07 MED ORDER — ONDANSETRON HCL 4 MG/2ML IJ SOLN
INTRAMUSCULAR | Status: AC
  Filled 2017-10-07: qty 2

## 2017-10-07 MED ORDER — SUCCINYLCHOLINE CHLORIDE 200 MG/10ML IV SOSY
PREFILLED_SYRINGE | INTRAVENOUS | Status: AC
  Filled 2017-10-07: qty 10

## 2017-10-07 SURGICAL SUPPLY — 68 items
BAG DECANTER FOR FLEXI CONT (MISCELLANEOUS) ×3 IMPLANT
BENZOIN TINCTURE PRP APPL 2/3 (GAUZE/BANDAGES/DRESSINGS) IMPLANT
BLADE CLIPPER SURG (BLADE) IMPLANT
BLADE SURG 11 STRL SS (BLADE) ×3 IMPLANT
BLADE ULTRA TIP 2M (BLADE) IMPLANT
BONE XPANSE 9.5 X 8.5 X 6MM (Bone Implant) ×3 IMPLANT
BUR MATCHSTICK NEURO 3.0 LAGG (BURR) ×3 IMPLANT
CAGE PEEK 7X16X14 (Cage) ×2 IMPLANT
CAGE PEEK 7X16X14MM (Cage) ×1 IMPLANT
CAGE PEEK TI 6X16X14 12D (Cage) ×6 IMPLANT
CANISTER SUCT 3000ML PPV (MISCELLANEOUS) ×3 IMPLANT
CARTRIDGE OIL MAESTRO DRILL (MISCELLANEOUS) ×1 IMPLANT
CLOSURE WOUND 1/2 X4 (GAUZE/BANDAGES/DRESSINGS)
DECANTER SPIKE VIAL GLASS SM (MISCELLANEOUS) ×3 IMPLANT
DERMABOND ADVANCED (GAUZE/BANDAGES/DRESSINGS) ×2
DERMABOND ADVANCED .7 DNX12 (GAUZE/BANDAGES/DRESSINGS) ×1 IMPLANT
DIFFUSER DRILL AIR PNEUMATIC (MISCELLANEOUS) ×3 IMPLANT
DRAIN CHANNEL 10M FLAT 3/4 FLT (DRAIN) IMPLANT
DRAPE C-ARM 42X72 X-RAY (DRAPES) ×6 IMPLANT
DRAPE HALF SHEET 40X57 (DRAPES) ×3 IMPLANT
DRAPE LAPAROTOMY 100X72 PEDS (DRAPES) ×3 IMPLANT
DRAPE MICROSCOPE LEICA (MISCELLANEOUS) ×3 IMPLANT
DRSG OPSITE POSTOP 3X4 (GAUZE/BANDAGES/DRESSINGS) IMPLANT
DRSG OPSITE POSTOP 4X6 (GAUZE/BANDAGES/DRESSINGS) ×3 IMPLANT
DURAPREP 6ML APPLICATOR 50/CS (WOUND CARE) ×3 IMPLANT
ELECT COATED BLADE 2.86 ST (ELECTRODE) ×3 IMPLANT
EVACUATOR SILICONE 100CC (DRAIN) IMPLANT
FLOSEAL 5ML (HEMOSTASIS) ×6 IMPLANT
GAUZE SPONGE 4X4 16PLY XRAY LF (GAUZE/BANDAGES/DRESSINGS) IMPLANT
GLOVE BIO SURGEON STRL SZ7.5 (GLOVE) ×3 IMPLANT
GLOVE BIOGEL PI IND STRL 6.5 (GLOVE) ×2 IMPLANT
GLOVE BIOGEL PI IND STRL 7.5 (GLOVE) ×2 IMPLANT
GLOVE BIOGEL PI INDICATOR 6.5 (GLOVE) ×4
GLOVE BIOGEL PI INDICATOR 7.5 (GLOVE) ×4
GLOVE ECLIPSE 7.0 STRL STRAW (GLOVE) ×3 IMPLANT
GLOVE ECLIPSE 9.0 STRL (GLOVE) ×3 IMPLANT
GLOVE EXAM NITRILE LRG STRL (GLOVE) IMPLANT
GLOVE EXAM NITRILE XL STR (GLOVE) IMPLANT
GLOVE EXAM NITRILE XS STR PU (GLOVE) IMPLANT
GLOVE SURG SS PI 6.5 STRL IVOR (GLOVE) ×12 IMPLANT
GOWN STRL REUS W/ TWL LRG LVL3 (GOWN DISPOSABLE) ×4 IMPLANT
GOWN STRL REUS W/ TWL XL LVL3 (GOWN DISPOSABLE) ×1 IMPLANT
GOWN STRL REUS W/TWL 2XL LVL3 (GOWN DISPOSABLE) IMPLANT
GOWN STRL REUS W/TWL LRG LVL3 (GOWN DISPOSABLE) ×8
GOWN STRL REUS W/TWL XL LVL3 (GOWN DISPOSABLE) ×2
HEMOSTAT POWDER KIT SURGIFOAM (HEMOSTASIS) IMPLANT
INSERT GRAFT XPANSE 9.5X8.5X6 (Bone Implant) ×6 IMPLANT
KIT BASIN OR (CUSTOM PROCEDURE TRAY) ×3 IMPLANT
KIT TURNOVER KIT B (KITS) ×3 IMPLANT
NEEDLE HYPO 22GX1.5 SAFETY (NEEDLE) ×3 IMPLANT
NEEDLE SPNL 22GX3.5 QUINCKE BK (NEEDLE) ×3 IMPLANT
NS IRRIG 1000ML POUR BTL (IV SOLUTION) ×3 IMPLANT
OIL CARTRIDGE MAESTRO DRILL (MISCELLANEOUS) ×3
PACK LAMINECTOMY NEURO (CUSTOM PROCEDURE TRAY) ×3 IMPLANT
PAD ARMBOARD 7.5X6 YLW CONV (MISCELLANEOUS) ×9 IMPLANT
PLATE 3 57.5XLCK NS SPNE CVD (Plate) ×1 IMPLANT
PLATE 3 ATLANTIS TRANS (Plate) ×2 IMPLANT
RUBBERBAND STERILE (MISCELLANEOUS) ×6 IMPLANT
SCREW SELF TAP VAR 4.0X13 (Screw) ×24 IMPLANT
SPONGE INTESTINAL PEANUT (DISPOSABLE) ×3 IMPLANT
SPONGE SURGIFOAM ABS GEL 100 (HEMOSTASIS) ×3 IMPLANT
STRIP CLOSURE SKIN 1/2X4 (GAUZE/BANDAGES/DRESSINGS) IMPLANT
SUT VIC AB 3-0 SH 8-18 (SUTURE) ×3 IMPLANT
SUT VICRYL 3-0 RB1 18 ABS (SUTURE) ×3 IMPLANT
TAPE CLOTH 3X10 TAN LF (GAUZE/BANDAGES/DRESSINGS) ×3 IMPLANT
TOWEL GREEN STERILE (TOWEL DISPOSABLE) ×3 IMPLANT
TOWEL GREEN STERILE FF (TOWEL DISPOSABLE) ×3 IMPLANT
WATER STERILE IRR 1000ML POUR (IV SOLUTION) ×3 IMPLANT

## 2017-10-07 NOTE — Transfer of Care (Signed)
Immediate Anesthesia Transfer of Care Note  Patient: Shellia Carwinhomas Parker  Procedure(s) Performed: ANTERIOR CERVICAL DECOMPRESSION/DISCECTOMY FUSION CERVICAL FOUR- CERVICAL FIVE, CERVICAL FIVE- CERVICAL SIX, CERVICAL SIX- CERVICAL SEVEN (N/A Spine Cervical)  Patient Location: PACU  Anesthesia Type:General  Level of Consciousness: awake, alert , oriented and patient cooperative  Airway & Oxygen Therapy: Patient Spontanous Breathing and Patient connected to nasal cannula oxygen  Post-op Assessment: Report given to RN, Post -op Vital signs reviewed and stable and Patient moving all extremities X 4  Post vital signs: Reviewed and stable  Last Vitals:  Vitals Value Taken Time  BP 121/76 10/07/2017 12:43 PM  Temp    Pulse 88 10/07/2017 12:48 PM  Resp 14 10/07/2017 12:48 PM  SpO2 95 % 10/07/2017 12:48 PM  Vitals shown include unvalidated device data.  Last Pain:  Vitals:   10/07/17 0750  TempSrc:   PainSc: 0-No pain      Patients Stated Pain Goal: 3 (10/07/17 0750)  Complications: No apparent anesthesia complications

## 2017-10-07 NOTE — Evaluation (Signed)
Physical Therapy Evaluation Patient Details Name: Nathaniel Rojas MRN: [id number] DOB: November 20, 1955 Today's Date: 10/07/2017   History of Present Illness  Pt is a 62 y/o male s/p C4-7 ACDF. PMH includes HTN and gout.   Clinical Impression  Patient is s/p above surgery resulting in the deficits listed below (see PT Problem List). Pt with mild unsteadiness, requiring min to min guard A for mobility, however, no overt LOB noted. Educated about precautions and generalized walking program to perform at home.  Patient will benefit from skilled PT to increase their independence and safety with mobility (while adhering to their precautions) to allow discharge to the venue listed below.     Follow Up Recommendations No PT follow up;Supervision for mobility/OOB    Equipment Recommendations  None recommended by PT    Recommendations for Other Services       Precautions / Restrictions Precautions Precautions: Cervical Precaution Booklet Issued: Yes (comment) Precaution Comments: Reviewed cervical precautions and generalized walking program Required Braces or Orthoses: Cervical Brace Cervical Brace: Hard collar Restrictions Weight Bearing Restrictions: No      Mobility  Bed Mobility Overal bed mobility: Needs Assistance Bed Mobility: Rolling;Sidelying to Sit;Sit to Sidelying Rolling: Supervision Sidelying to sit: Supervision     Sit to sidelying: Supervision General bed mobility comments: Supervision to ensure log roll technique. Cues for sequencing.   Transfers Overall transfer level: Needs assistance Equipment used: None Transfers: Sit to/from Stand Sit to Stand: Min assist         General transfer comment: Min A for steadying assist.   Ambulation/Gait Ambulation/Gait assistance: Min guard Gait Distance (Feet): 150 Feet Assistive device: None Gait Pattern/deviations: Step-through pattern;Decreased stride length Gait velocity: Decreased    General Gait Details: Slow,  guarded, waddle type gait. Mild unsteadiness noted, however, no overt LOB. Educated about generalized walking program to perform at home.   Stairs            Wheelchair Mobility    Modified Rankin (Stroke Patients Only)       Balance Overall balance assessment: Needs assistance Sitting-balance support: No upper extremity supported;Feet supported Sitting balance-Leahy Scale: Good     Standing balance support: No upper extremity supported;During functional activity Standing balance-Leahy Scale: Fair                               Pertinent Vitals/Pain Pain Assessment: Faces Faces Pain Scale: Hurts little more Pain Location: neck  Pain Descriptors / Indicators: Aching;Operative site guarding Pain Intervention(s): Limited activity within patient's tolerance;Monitored during session;Repositioned    Home Living Family/patient expects to be discharged to:: Private residence Living Arrangements: Spouse/significant other Available Help at Discharge: Family;Available 24 hours/day Type of Home: House Home Access: Stairs to enter Entrance Stairs-Rails: None Entrance Stairs-Number of Steps: 1 Home Layout: One level Home Equipment: None      Prior Function Level of Independence: Independent               Hand Dominance        Extremity/Trunk Assessment   Upper Extremity Assessment Upper Extremity Assessment: Defer to OT evaluation(Reports R arm pain at baseline )    Lower Extremity Assessment Lower Extremity Assessment: Overall WFL for tasks assessed    Cervical / Trunk Assessment Cervical / Trunk Assessment: Other exceptions Cervical / Trunk Exceptions: s/p ACDF   Communication   Communication: No difficulties  Cognition Arousal/Alertness: Awake/alert Behavior During Therapy: WFL for tasks assessed/performed Overall Cognitive  Status: Within Functional Limits for tasks assessed                                        General  Comments General comments (skin integrity, edema, etc.): Pt's wife present during session.     Exercises     Assessment/Plan    PT Assessment Patient needs continued PT services  PT Problem List Decreased balance;Decreased mobility;Decreased knowledge of precautions;Pain       PT Treatment Interventions DME instruction;Gait training;Functional mobility training;Stair training;Therapeutic activities;Therapeutic exercise;Balance training;Patient/family education    PT Goals (Current goals can be found in the Care Plan section)  Acute Rehab PT Goals Patient Stated Goal: "to go home tomorrow" PT Goal Formulation: With patient Time For Goal Achievement: 10/21/17 Potential to Achieve Goals: Good    Frequency Min 5X/week   Barriers to discharge        Co-evaluation               AM-PAC PT "6 Clicks" Daily Activity  Outcome Measure Difficulty turning over in bed (including adjusting bedclothes, sheets and blankets)?: A Little Difficulty moving from lying on back to sitting on the side of the bed? : A Little Difficulty sitting down on and standing up from a chair with arms (e.g., wheelchair, bedside commode, etc,.)?: Unable Help needed moving to and from a bed to chair (including a wheelchair)?: A Little Help needed walking in hospital room?: A Little Help needed climbing 3-5 steps with a railing? : A Lot 6 Click Score: 15    End of Session Equipment Utilized During Treatment: Gait belt;Cervical collar Activity Tolerance: Patient tolerated treatment well Patient left: with nursing/sitter in room;with family/visitor present(in bathroom ) Nurse Communication: Mobility status PT Visit Diagnosis: Unsteadiness on feet (R26.81);Other abnormalities of gait and mobility (R26.89);Pain Pain - part of body: (neck )    Time: 1713-1730 PT Time Calculation (min) (ACUTE ONLY): 17 min   Charges:   PT Evaluation $PT Eval Low Complexity: 1 Low     PT G Codes:        Nathaniel Rojas, PT, DPT  Acute Rehabilitation Services  Pager: [phone]   Nathaniel Rojas 10/07/2017, 5:43 PM

## 2017-10-07 NOTE — Anesthesia Postprocedure Evaluation (Signed)
Anesthesia Post Note  Patient: Nathaniel Rojas  Procedure(s) Performed: ANTERIOR CERVICAL DECOMPRESSION/DISCECTOMY FUSION CERVICAL FOUR- CERVICAL FIVE, CERVICAL FIVE- CERVICAL SIX, CERVICAL SIX- CERVICAL SEVEN (N/A Spine Cervical)     Patient location during evaluation: PACU Anesthesia Type: General Level of consciousness: sedated Pain management: pain level controlled Vital Signs Assessment: post-procedure vital signs reviewed and stable Respiratory status: spontaneous breathing and respiratory function stable Cardiovascular status: stable Postop Assessment: no apparent nausea or vomiting Anesthetic complications: no    Last Vitals:  Vitals:   10/07/17 1330 10/07/17 1345  BP: 122/62 123/68  Pulse: 87 88  Resp: 13 14  Temp:    SpO2: 94% 96%    Last Pain:  Vitals:   10/07/17 1345  TempSrc:   PainSc: 5                  Carlis Burnsworth DANIEL

## 2017-10-07 NOTE — Anesthesia Preprocedure Evaluation (Addendum)
Anesthesia Evaluation  Patient identified by MRN, date of birth, ID band Patient awake    Reviewed: Allergy & Precautions, NPO status , Patient's Chart, lab work & pertinent test results, reviewed documented beta blocker date and time   History of Anesthesia Complications Negative for: history of anesthetic complications  Airway Mallampati: II  TM Distance: >3 FB Neck ROM: Full    Dental no notable dental hx. (+) Dental Advisory Given   Pulmonary former smoker,    Pulmonary exam normal        Cardiovascular hypertension, Pt. on medications and Pt. on home beta blockers Normal cardiovascular exam     Neuro/Psych negative neurological ROS  negative psych ROS   GI/Hepatic negative GI ROS, Neg liver ROS,   Endo/Other  Hypothyroidism   Renal/GU negative Renal ROS     Musculoskeletal   Abdominal   Peds  Hematology   Anesthesia Other Findings   Reproductive/Obstetrics                            Anesthesia Physical Anesthesia Plan  ASA: II  Anesthesia Plan: General   Post-op Pain Management:    Induction: Intravenous  PONV Risk Score and Plan: 2 and Ondansetron, Dexamethasone and Diphenhydramine  Airway Management Planned: Oral ETT  Additional Equipment:   Intra-op Plan:   Post-operative Plan: Extubation in OR  Informed Consent: I have reviewed the patients History and Physical, chart, labs and discussed the procedure including the risks, benefits and alternatives for the proposed anesthesia with the patient or authorized representative who has indicated his/her understanding and acceptance.   Dental advisory given  Plan Discussed with: CRNA, Anesthesiologist and Surgeon  Anesthesia Plan Comments:       Anesthesia Quick Evaluation

## 2017-10-07 NOTE — H&P (Signed)
Chief Complaint   Neck pain  HPI   HPI: Nathaniel Rojas is a 62 y.o. male with long standing history of right sided neck pain, arm pain and weakness. He has noted difficulty raising his arms above about shoulder level, which has caused difficulty washing his hair, and other normal daily activities.  He was having a fair bit of pain in the left arm, although that has largely improved.  He has not noted any changes in his ability to walk, limited primarily by his low back pain.  He has not had any changes in bowel or bladder function. MRI of his cervical spine was ordered and noted for significant disc degeneration at C4-5, C5-6 and to a lesser extent at C6-7.  There is broad-based disc osteophyte complex at these levels with resultant spinal cord compression and stenosis.  There is evidence of myelomalacia in the right hemi cord at C4-5. He presents today for surgical intervention. He is without any concerns today.   There are no active problems to display for this patient.   PMH: Past Medical History:  Diagnosis Date  . Gout   . Hyperlipidemia   . Hypertension   . Hypothyroid     PSH: Past Surgical History:  Procedure Laterality Date  . COLONOSCOPY    . NO PAST SURGERIES      Medications Prior to Admission  Medication Sig Dispense Refill Last Dose  . allopurinol (ZYLOPRIM) 300 MG tablet Take 300 mg by mouth daily.   Taking  . amLODipine (NORVASC) 10 MG tablet Take 10 mg by mouth daily.   Taking  . aspirin EC 81 MG tablet Take 81 mg by mouth daily.     Marland Kitchen. atorvastatin (LIPITOR) 10 MG tablet Take 10 mg by mouth daily.   Taking  . bisoprolol (ZEBETA) 5 MG tablet Take 5 mg by mouth daily.   Taking  . HYDROcodone-acetaminophen (NORCO/VICODIN) 5-325 MG tablet Take 1 tablet by mouth every 8 (eight) hours as needed. for pain  0   . levothyroxine (SYNTHROID, LEVOTHROID) 100 MCG tablet Take 100 mcg by mouth daily before breakfast.   Taking  . meloxicam (MOBIC) 7.5 MG tablet Take 7.5 mg by  mouth daily.     . Multiple Vitamins-Minerals (MULTIVITAMIN WITH MINERALS) tablet Take 1 tablet by mouth daily.     . Omega-3 Fatty Acids (FISH OIL) 1000 MG CAPS Take 2 capsules by mouth daily.     . tamsulosin (FLOMAX) 0.4 MG CAPS capsule Take 0.4 mg by mouth daily.    Taking    SH: Social History   Tobacco Use  . Smoking status: Former Smoker    Types: Cigarettes    Last attempt to quit: 03/22/2009    Years since quitting: 8.5  . Smokeless tobacco: Never Used  Substance Use Topics  . Alcohol use: Yes    Alcohol/week: 1.2 oz    Types: 2 Cans of beer per week  . Drug use: No    MEDS: Prior to Admission medications   Medication Sig Start Date End Date Taking? Authorizing Provider  allopurinol (ZYLOPRIM) 300 MG tablet Take 300 mg by mouth daily.   Yes [provider]  amLODipine (NORVASC) 10 MG tablet Take 10 mg by mouth daily.   Yes [provider]  aspirin EC 81 MG tablet Take 81 mg by mouth daily.   Yes [provider]  atorvastatin (LIPITOR) 10 MG tablet Take 10 mg by mouth daily.   Yes [provider]  bisoprolol (  ZEBETA) 5 MG tablet Take 5 mg by mouth daily.   Yes [provider]  HYDROcodone-acetaminophen (NORCO/VICODIN) 5-325 MG tablet Take 1 tablet by mouth every 8 (eight) hours as needed. for pain 09/13/17  Yes [provider]  levothyroxine (SYNTHROID, LEVOTHROID) 100 MCG tablet Take 100 mcg by mouth daily before breakfast.   Yes [provider]  meloxicam (MOBIC) 7.5 MG tablet Take 7.5 mg by mouth daily.   Yes [provider]  Multiple Vitamins-Minerals (MULTIVITAMIN WITH MINERALS) tablet Take 1 tablet by mouth daily.   Yes [provider]  Omega-3 Fatty Acids (FISH OIL) 1000 MG CAPS Take 2 capsules by mouth daily.   Yes [provider]  tamsulosin (FLOMAX) 0.4 MG CAPS capsule Take 0.4 mg by mouth daily.    Yes [provider]    ALLERGY: Allergies  Allergen Reactions  .  Niacin And Related Other (See Comments)    Flushing    Social History   Tobacco Use  . Smoking status: Former Smoker    Types: Cigarettes    Last attempt to quit: 03/22/2009    Years since quitting: 8.5  . Smokeless tobacco: Never Used  Substance Use Topics  . Alcohol use: Yes    Alcohol/week: 1.2 oz    Types: 2 Cans of beer per week     No family history on file.   ROS   ROS  Exam   Vitals:   10/07/17 0723  BP: (!) 147/80  Pulse: 68  Resp: 20  Temp: 98.3 F (36.8 C)  SpO2: 97%   General appearance: WDWN, NAD Eyes: PERRL, Fundoscopic: normal Cardiovascular: Regular rate and rhythm without murmurs, rubs, gallops. No edema or variciosities. Distal pulses normal. Pulmonary: Clear to auscultation Musculoskeletal:     Muscle tone upper extremities: Normal    Muscle tone lower extremities: Normal    Motor exam: Upper Extremities Deltoid Bicep Tricep Grip  Right 4+/5 4/5 4/5 5/5  Left 5/5 5/5 5/5 5/5   Lower Extremity IP Quad PF DF EHL  Right 5/5 5/5 5/5 5/5 5/5  Left 5/5 5/5 5/5 5/5 5/5   Neurological Awake, alert, oriented Memory and concentration grossly intact Speech fluent, appropriate CNII: Visual fields normal CNIII/IV/VI: EOMI CNV: Facial sensation normal CNVII: Symmetric, normal strength CNVIII: Grossly normal CNIX: Normal palate movement CNXI: Trap and SCM strength normal CN XII: Tongue protrusion normal Sensation grossly intact to LT DTR: Normal Coordination (finger/nose & heel/shin): Normal  Results - Imaging/Labs   No results found for this or any previous visit (from the past 48 hour(s)).  No results found.  Impression/Plan   62 y.o. male with primarily right-sided arm weakness likely related to multilevel disc degeneration with central and foraminal stenosis and MRI evidence of myelomalacia at C4-5, C5-6 and C6-7. Stenosis to a lesser extent at C3-4. He presents today for C4-7 ACDF.  While in the office, Risks, benefits and  alternatives to surgery were discussed.  He states known language understanding of the procedure and wished to proceed.  Consent signed.

## 2017-10-07 NOTE — Op Note (Signed)
NEUROSURGERY OPERATIVE NOTE   PREOP DIAGNOSIS: Cervical Spondylosis with radiculopathy, C4-5, C5-6, C6-7  POSTOP DIAGNOSIS: Same  PROCEDURE: 1. Discectomy at C4-5, C5-6, C6-7 for decompression of spinal cord and exiting nerve roots  2. Placement of intervertebral biomechanical device, [employer] 63m lordotic PTC PEEK cage @ C4-5, C5-6, 728mlordotic @ C6-7 3. Placement of anterior instrumentation consisting of interbody plate and screws spanning C4-C7 4. Use of morselized bone allograft  5. Arthrodesis C4-5, C5-6, C6-7, anterior interbody technique  6. Use of intraoperative microscope  SURGEON: Dr. NeConsuella LoseMD  ASSISTANT: Dr. HeEarnie LarssonMD  ANESTHESIA: General Endotracheal  EBL: 75cc  SPECIMENS: None  DRAINS: None  COMPLICATIONS: None immediate  CONDITION: Hemodynamically stable to PACU  HISTORY: Nathaniel Takagis a [age].o. man initially presented to the outpatient neurosurgery clinic with neck pain and right-sided arm pain and weakness.  Although he did initially have left-sided symptoms these essentially resolved spontaneously.  He attempted conservative treatment which was unsuccessful.  His MRI did demonstrate significant stenosis with lateral recess and foraminal stenosis due to disc osteophyte complex worst at C4-5, C5-6, C6-7.  He therefore elected to proceed with surgical decompression and fusion.  The risks and benefits of the surgery were explained in detail to the patient and his family.  After all questions were answered informed consent was obtained and witnessed.  PROCEDURE IN DETAIL: The patient was brought to the operating room and transferred to the operative table. After induction of general anesthesia, the patient was positioned on the operative table in the supine position with all pressure points meticulously padded. The skin of the neck was then prepped and draped in the usual sterile fashion.  After timeout was conducted, the skin was infiltrated  with local anesthetic. Skin incision was then made sharply and Bovie electrocautery was used to dissect the subcutaneous tissue until the platysma was identified. The platysma was then divided and undermined. The sternocleidomastoid muscle was then identified and, utilizing natural fascial planes in the neck, the prevertebral fascia was identified and the carotid sheath was retracted laterally and the trachea and esophagus retracted medially. Again using fluoroscopy, the correct disc spaces were identified. Bovie electrocautery was used to dissect in the subperiosteal plane and elevate the bilateral longus coli muscles. Table mounted retractors were then placed. At this point, the microscope was draped and brought into the field, and the remainder of the case was done under the microscope using microdissecting technique.  The C6-7 disc space was incised sharply and rongeurs were use to initially complete a discectomy. The high-speed drill was then used to complete discectomy until the posterior annulus was identified and removed and the posterior longitudinal ligament was identified. Using a nerve hook, the PLL was elevated, and Kerrison rongeurs were used to remove the posterior longitudinal ligament and the ventral thecal sac was identified. Using a combination of curettes and rongeurs, complete decompression of the thecal sac and exiting nerve roots at this level was completed, and verified using micro-nerve hook.  Special attention was paid to the right C7 nerve root which was decompressed by removal of the right uncovertebral joint.  At this point, a 7 mm lordotic interbody cage was sized and packed with morcellized bone allograft. This was then inserted and tapped into place.  Attention was then turned to the C5-6 level. In a similar fashion, discectomy was completed initially with curettes and rongeurs, and completed with the drill. The PLL was again identified, elevated and incised. Using Kerrison  rongeurs,  decompression of the spinal cord and exiting roots was completed and confirmed with a dissector.  Again, the right C6 root was decompressed by removal of the right C5-6 uncovertebral joint.  A 6 mm lordotic interbody cage was then sized and filled with bone allograft, and tapped into place. Position of the interbody devices was then confirmed with fluoroscopy.  Attention was then turned to the C4-5 level. In a similar fashion, discectomy was completed initially with curettes and rongeurs, and completed with the drill. The PLL was again identified, elevated and incised.  There was a significant amount of thickening of the posterior longitudinal ligament and disc bulge through the posterior annulus causing some ventral compression of the spinal cord, with significant posterior osteophytosis compressing the right C5 nerve root.  Complete decompression was completed again by removal of the uncovertebral joint especially on the right.  A 6 mm lordotic interbody cage was then sized and filled with bone allograft, and tapped into place. Position of the interbody devices was then confirmed with fluoroscopy.  After placement of the intervertebral devices, the anterior cervical plate was selected, and placed across the interspaces. Using a high-speed drill, the cortex of the cervical vertebral bodies was punctured, and screws inserted in the C4, C5, C6, and C7 levels. Final fluoroscopic images in lateral projection was taken to confirm good hardware placement.  At this point, after all counts were verified to be correct, meticulous hemostasis was secured using a combination of bipolar electrocautery and passive hemostatics. The platysma muscle was then closed using interrupted 3-0 Vicryl sutures, and the skin was closed with a interrupted subcuticular stitch. Sterile dressings were then applied and the drapes removed.  The patient tolerated the procedure well and was extubated in the room and taken to  the postanesthesia care unit in stable condition.

## 2017-10-07 NOTE — Anesthesia Procedure Notes (Signed)
Procedure Name: Intubation Date/Time: 10/07/2017 9:03 AM Performed by: Rogelia BogaMueller, Greyden P, CRNA Pre-anesthesia Checklist: Patient identified, Emergency Drugs available, Suction available, Patient being monitored and Timeout performed Patient Re-evaluated:Patient Re-evaluated prior to induction Oxygen Delivery Method: Circle system utilized Preoxygenation: Pre-oxygenation with 100% oxygen Induction Type: IV induction Ventilation: Mask ventilation without difficulty Laryngoscope Size: Glidescope and 4 Grade View: Grade I Tube type: Oral Tube size: 7.5 mm Number of attempts: 1 Airway Equipment and Method: Stylet Placement Confirmation: ETT inserted through vocal cords under direct vision,  positive ETCO2 and breath sounds checked- equal and bilateral Secured at: 22 cm Tube secured with: Tape Dental Injury: Teeth and Oropharynx as per pre-operative assessment  Comments: Elective Glidescope chosen because of documented cervical myelopathy, Not utilized due to anticipated difficult airway.

## 2017-10-08 LAB — BASIC METABOLIC PANEL
Anion gap: 9 (ref 5–15)
BUN: 18 mg/dL (ref 6–20)
CO2: 26 mmol/L (ref 22–32)
Calcium: 8.9 mg/dL (ref 8.9–10.3)
Chloride: 102 mmol/L (ref 98–111)
Creatinine, Ser: 1.4 mg/dL — ABNORMAL HIGH (ref 0.61–1.24)
GFR calc Af Amer: >60 mL/min (ref >60)
GFR calc non Af Amer: 54 mL/min — ABNORMAL LOW (ref >60)
Glucose, Bld: 194 mg/dL — ABNORMAL HIGH (ref 70–99)
Potassium: 4.3 mmol/L (ref 3.5–5.1)
Sodium: 137 mmol/L (ref 135–145)

## 2017-10-08 LAB — PROTIME-INR
INR: 1.19
Prothrombin Time: 15.1 seconds (ref 11.4–15.2)

## 2017-10-08 LAB — CBC
HCT: 36.2 % — ABNORMAL LOW (ref 39.0–52.0)
Hemoglobin: 12.7 g/dL — ABNORMAL LOW (ref 13.0–17.0)
MCH: 32.5 pg (ref 26.0–34.0)
MCHC: 35.1 g/dL (ref 30.0–36.0)
MCV: 92.6 fL (ref 78.0–100.0)
Platelets: 168 10*3/uL (ref 150–400)
RBC: 3.91 MIL/uL — ABNORMAL LOW (ref 4.22–5.81)
RDW: 13 % (ref 11.5–15.5)
WBC: 15.9 10*3/uL — ABNORMAL HIGH (ref 4.0–10.5)

## 2017-10-08 LAB — APTT: aPTT: 27 seconds (ref 24–36)

## 2017-10-08 MED ORDER — GABAPENTIN 300 MG PO CAPS: 300.0000 mg | ORAL_CAPSULE | Freq: Three times a day (TID) | ORAL | 0 refills | Status: DC

## 2017-10-08 MED ORDER — METHOCARBAMOL 500 MG PO TABS: 500.0000 mg | ORAL_TABLET | Freq: Four times a day (QID) | ORAL | 0 refills | Status: DC | PRN

## 2017-10-08 MED ORDER — OXYCODONE-ACETAMINOPHEN 5-325 MG PO TABS
1.0000 | ORAL_TABLET | ORAL | Status: DC | PRN
  Administered 2017-10-08: 2 via ORAL
  Filled 2017-10-08: qty 2

## 2017-10-08 MED ORDER — OXYCODONE-ACETAMINOPHEN 5-325 MG PO TABS: 1.0000 | ORAL_TABLET | ORAL | 0 refills | Status: DC | PRN

## 2017-10-08 NOTE — Evaluation (Signed)
Occupational Therapy Evaluation Patient Details Name: Nathaniel Rojas MRN: [id number] DOB: Mar 23, 1955 Today's Date: 10/08/2017    History of Present Illness Pt is a 62 y/o male s/p C4-7 ACDF. PMH includes HTN and gout.    Clinical Impression   PTA, pt was independent with ADL and functional mobility. He is currently limited by cervical pain but motivated to return to PLOF. Pt educated concerning cervical precautions related to ADL participation as well as safe compensatory strategies. He was sitting up in bed without cervical collar eating breakfast and educated concerning importance of wearing as well as methods to don brace and change padding when soiled. Pt and wife verbalize understanding. Educated pt concerning safe positioning for showering (as he is able to shower without brace per orders) as well as tub/shower transfers and he required min guard assist for this. Pt otherwise requires supervision for ADL. Pt's wife demonstrates the ability to provide necessary assistance. No further acute OT needs identified. Do not anticipate need for further OT follow-up. Will sign off.     Follow Up Recommendations  No OT follow up;Supervision/Assistance - 24 hour    Equipment Recommendations  None recommended by OT    Recommendations for Other Services       Precautions / Restrictions Precautions Precautions: Cervical Precaution Booklet Issued: Yes (comment) Precaution Comments: Reviewed cervical precautions and generalized walking program Required Braces or Orthoses: Cervical Brace Cervical Brace: Hard collar;At all times Restrictions Weight Bearing Restrictions: No      Mobility Bed Mobility Overal bed mobility: Needs Assistance Bed Mobility: Sit to Sidelying;Rolling Rolling: Supervision Sidelying to sit: Supervision     Sit to sidelying: Supervision General bed mobility comments: Supervision for safety.   Transfers Overall transfer level: Needs assistance Equipment used:  None Transfers: Sit to/from Stand Sit to Stand: Supervision         General transfer comment: Supervision for safety.     Balance Overall balance assessment: Needs assistance Sitting-balance support: No upper extremity supported;Feet supported Sitting balance-Leahy Scale: Good     Standing balance support: No upper extremity supported;During functional activity Standing balance-Leahy Scale: Good                             ADL either performed or assessed with clinical judgement   ADL Overall ADL's : Needs assistance/impaired Eating/Feeding: Supervision/ safety;Bed level Eating/Feeding Details (indicate cue type and reason): Pt sitting up in bed eating without cervical collar. Educated on need to wear cervical collar.  Grooming: Supervision/safety;Standing   Upper Body Bathing: Supervision/ safety;Sitting   Lower Body Bathing: Supervison/ safety;Sit to/from stand   Upper Body Dressing : Supervision/safety;Sitting   Lower Body Dressing: Supervision/safety;Sit to/from stand   Toilet Transfer: Supervision/safety;Ambulation   Toileting- Clothing Manipulation and Hygiene: Supervision/safety;Sit to/from stand   Tub/ Shower Transfer: Min guard;Ambulation;Tub transfer   Functional mobility during ADLs: Supervision/safety General ADL Comments: Pt and wife educated concerning cervical precautions related to ADL participation. He was sitting up in bed without cervical collar on while eating and educated pt on need to wear cervical collar for stability. Per orders, he is able to remove collar for showering.      Vision Baseline Vision/History: No visual deficits Vision Assessment?: No apparent visual deficits     Perception     Praxis      Pertinent Vitals/Pain Pain Assessment: 0-10 Pain Score: 5  Faces Pain Scale: No hurt Pain Location: neck and traps Pain Descriptors / Indicators: Operative  site guarding;Sore Pain Intervention(s): Limited activity within  patient's tolerance;Monitored during session;Repositioned     Hand Dominance     Extremity/Trunk Assessment Upper Extremity Assessment Upper Extremity Assessment: RUE deficits/detail RUE Deficits / Details: Limited R shoulder AROM at baseline. Reports R numbness/tingling has resolved.    Lower Extremity Assessment Lower Extremity Assessment: Overall WFL for tasks assessed   Cervical / Trunk Assessment Cervical / Trunk Assessment: Other exceptions Cervical / Trunk Exceptions: s/p ACDF    Communication Communication Communication: No difficulties   Cognition Arousal/Alertness: Awake/alert Behavior During Therapy: WFL for tasks assessed/performed Overall Cognitive Status: Within Functional Limits for tasks assessed                                     General Comments  Pt's wife present and engaged in session. Educated pt and wife concerning methods to adjust and apply cervical collar as well as to remove and replace padding when soiled.     Exercises     Shoulder Instructions      Home Living Family/patient expects to be discharged to:: Private residence Living Arrangements: Spouse/significant other Available Help at Discharge: Family;Available 24 hours/day Type of Home: House Home Access: Stairs to enter Entergy CorporationEntrance Stairs-Number of Steps: 1 Entrance Stairs-Rails: None Home Layout: One level     Bathroom Shower/Tub: Chief Strategy OfficerTub/shower unit   Bathroom Toilet: Handicapped height     Home Equipment: None          Prior Functioning/Environment Level of Independence: Independent                 OT Problem List: Decreased activity tolerance;Impaired balance (sitting and/or standing);Decreased safety awareness;Decreased knowledge of use of DME or AE;Decreased knowledge of precautions;Pain      OT Treatment/Interventions:      OT Goals(Current goals can be found in the care plan section) Acute Rehab OT Goals Patient Stated Goal: go for walks outside and  get R UE working well OT Goal Formulation: With patient/family  OT Frequency:     Barriers to D/C:            Co-evaluation              AM-PAC PT "6 Clicks" Daily Activity     Outcome Measure Help from another person eating meals?: None Help from another person taking care of personal grooming?: None Help from another person toileting, which includes using toliet, bedpan, or urinal?: None Help from another person bathing (including washing, rinsing, drying)?: A Little Help from another person to put on and taking off regular upper body clothing?: None Help from another person to put on and taking off regular lower body clothing?: None 6 Click Score: 23   End of Session Equipment Utilized During Treatment: Cervical collar Nurse Communication: Mobility status  Activity Tolerance: Patient tolerated treatment well Patient left: with call bell/phone within reach;with nursing/sitter in room;with family/visitor present(standing in room with RN present)  OT Visit Diagnosis: Other abnormalities of gait and mobility (R26.89);Pain Pain - part of body: (cervical)                Time: [id number] OT Time Calculation (min): 17 min Charges:  OT General Charges $OT Visit: 1 Visit OT Evaluation $OT Eval Low Complexity: 1 Low G-Codes:     Doristine Sectionharity A Nery Frappier, MS OTR/L  Pager: [phone]   Abella Shugart A Aodhan Scheidt 10/08/2017, 9:10 AM

## 2017-10-08 NOTE — Progress Notes (Signed)
Patient alert and oriented, mae's well, voiding adequate amount of urine, swallowing without difficulty, no c/o pain at time of discharge. Patient discharged home with family. Script and discharged instructions given to patient. Patient and family stated understanding of instructions given. Patient has an appointment with Dr. Nundkumar  

## 2017-10-08 NOTE — Discharge Summary (Signed)
Physician Discharge Summary  Patient ID: Nathaniel Rojas MRN: [id number] DOB/AGE: 1955/08/01 62 y.o.  Admit date: 10/07/2017 Discharge date: 10/08/2017  Admission Diagnoses: Cervical spondylosis with radiculopathy  Discharge Diagnoses: Cervical spondylosis with radiculopathy Active Problems:   Stenosis of cervical spine with myelopathy [facility](HCC)   Discharged Condition: Good  Hospital Course: Patient was admitted, underwent a 3 level C4-5 C5-6 and C6-7 ACDF by Dr. Conchita ParisNundkumar.  Postoperative the patient is done well.  He is up and ambulating actively.  He is being discharged home with instructions regarding wound care and activities.  He is to contact the office and schedule postoperative visit.  Discharge Exam: Blood pressure (!) 127/57, pulse 67, temperature 98.6 F (37 C), temperature source Oral, resp. rate 16, height 5\' 8"  (1.727 m), weight 83.5 kg (184 lb), SpO2 95 %.  Disposition: Discharge disposition: 01-Home or Self Care       Discharge Instructions    Discharge wound care:   Complete by:  As directed    Leave the wound open to air. Shower daily with the wound uncovered. Water and soapy water should run over the incision area. Do not wash directly on the incision for 2 weeks. Remove the glue after 2 weeks.   Driving Restrictions   Complete by:  As directed    No driving for 2 weeks. May ride in the car locally now. May begin to drive locally in 2 weeks.   Other Restrictions   Complete by:  As directed    Walk gradually increasing distances out in the fresh air at least twice a day. Walking additional 6 times inside the house, gradually increasing distances, daily. No bending, lifting, or twisting. Perform activities between shoulder and waist height (that is at counter height when standing or table height when sitting).     Allergies as of 10/08/2017      Reactions   Niacin And Related Other (See Comments)   Flushing      Medication List    STOP taking these  medications   atorvastatin 10 MG tablet Commonly known as:  LIPITOR     TAKE these medications   allopurinol 300 MG tablet Commonly known as:  ZYLOPRIM Take 300 mg by mouth daily.   amLODipine 10 MG tablet Commonly known as:  NORVASC Take 10 mg by mouth daily.   aspirin EC 81 MG tablet Take 81 mg by mouth daily.   bisoprolol 5 MG tablet Commonly known as:  ZEBETA Take 5 mg by mouth daily.   Fish Oil 1000 MG Caps Take 2 capsules by mouth daily.   gabapentin 300 MG capsule Commonly known as:  NEURONTIN Take 1 capsule (300 mg total) by mouth 3 (three) times daily.   HYDROcodone-acetaminophen 5-325 MG tablet Commonly known as:  NORCO/VICODIN Take 1 tablet by mouth every 8 (eight) hours as needed. for pain   levothyroxine 100 MCG tablet Commonly known as:  SYNTHROID, LEVOTHROID Take 100 mcg by mouth daily before breakfast.   meloxicam 7.5 MG tablet Commonly known as:  MOBIC Take 7.5 mg by mouth daily.   methocarbamol 500 MG tablet Commonly known as:  ROBAXIN Take 1 tablet (500 mg total) by mouth every 6 (six) hours as needed for muscle spasms.   multivitamin with minerals tablet Take 1 tablet by mouth daily.   oxyCODONE-acetaminophen 5-325 MG tablet Commonly known as:  PERCOCET/ROXICET Take 1-2 tablets by mouth every 4 (four) hours as needed (pain).   tamsulosin 0.4 MG Caps capsule Commonly known as:  FLOMAX Take 0.4 mg by mouth daily.            Discharge Care Instructions  (From admission, onward)        Start     Ordered   10/08/17 0000  Discharge wound care:    Comments:  Leave the wound open to air. Shower daily with the wound uncovered. Water and soapy water should run over the incision area. Do not wash directly on the incision for 2 weeks. Remove the glue after 2 weeks.   10/08/17 0827       Signed: Hewitt Shorts 10/08/2017, 8:54 AM

## 2017-10-08 NOTE — Progress Notes (Signed)
Physical Therapy Treatment Patient Details Name: Nathaniel Rojas MRN: [id number] DOB: Jul 21, 1955 Today's Date: 10/08/2017    History of Present Illness Pt is a 62 y/o male s/p C4-7 ACDF. PMH includes HTN and gout.     PT Comments    Pt making excellent progress with functional mobility and successfully completed stair training without difficulties. Plan is for pt to d/c home today with family. Pt is ready to d/c from PT perspective.   Follow Up Recommendations  No PT follow up;Supervision for mobility/OOB     Equipment Recommendations  None recommended by PT    Recommendations for Other Services       Precautions / Restrictions Precautions Precautions: Cervical Precaution Booklet Issued: Yes (comment) Precaution Comments: Reviewed cervical precautions and generalized walking program Required Braces or Orthoses: Cervical Brace Cervical Brace: Hard collar;At all times Restrictions Weight Bearing Restrictions: No    Mobility  Bed Mobility Overal bed mobility: Needs Assistance Bed Mobility: Sit to Sidelying;Rolling Rolling: Supervision       Sit to sidelying: Supervision    Transfers Overall transfer level: Modified independent Equipment used: None                Ambulation/Gait Ambulation/Gait assistance: Supervision Gait Distance (Feet): 400 Feet Assistive device: None Gait Pattern/deviations: Step-through pattern;Decreased stride length Gait velocity: Decreased  Gait velocity interpretation: <1.31 ft/sec, indicative of household ambulator General Gait Details: pt steady with reciprocal, heel-toe gait pattern bilaterally; no instabililty or LOB, supervision for safety   Stairs Stairs: Yes Stairs assistance: Supervision Stair Management: One rail Left;Step to pattern;Sideways;Forwards Number of Stairs: 10 General stair comments: supervision and cueing for safety   Wheelchair Mobility    Modified Rankin (Stroke Patients Only)       Balance  Overall balance assessment: Needs assistance Sitting-balance support: No upper extremity supported;Feet supported Sitting balance-Leahy Scale: Good     Standing balance support: No upper extremity supported;During functional activity Standing balance-Leahy Scale: Good                              Cognition Arousal/Alertness: Awake/alert Behavior During Therapy: WFL for tasks assessed/performed Overall Cognitive Status: Within Functional Limits for tasks assessed                                        Exercises      General Comments        Pertinent Vitals/Pain Pain Assessment: Faces Faces Pain Scale: No hurt    Home Living                      Prior Function            PT Goals (current goals can now be found in the care plan section) Acute Rehab PT Goals PT Goal Formulation: With patient Time For Goal Achievement: 10/21/17 Potential to Achieve Goals: Good Progress towards PT goals: Progressing toward goals    Frequency    Min 5X/week      PT Plan Current plan remains appropriate    Co-evaluation              AM-PAC PT "6 Clicks" Daily Activity  Outcome Measure  Difficulty turning over in bed (including adjusting bedclothes, sheets and blankets)?: None Difficulty moving from lying on back to sitting on the side of the bed? : None  Difficulty sitting down on and standing up from a chair with arms (e.g., wheelchair, bedside commode, etc,.)?: None Help needed moving to and from a bed to chair (including a wheelchair)?: None Help needed walking in hospital room?: None Help needed climbing 3-5 steps with a railing? : None 6 Click Score: 24    End of Session Equipment Utilized During Treatment: Cervical collar Activity Tolerance: Patient tolerated treatment well Patient left: in bed;with call bell/phone within reach;with family/visitor present Nurse Communication: Mobility status PT Visit Diagnosis: Unsteadiness  on feet (R26.81);Other abnormalities of gait and mobility (R26.89);Pain Pain - part of body: (neck)     Time: [id number] PT Time Calculation (min) (ACUTE ONLY): 10 min  Charges:  $Gait Training: 8-22 mins                    G Codes:       [place]townJennifer Keshonda Monsour, [place] CarolinaPT, [place]PT [phone]    Alessandra BevelsJennifer M Elih Mooney 10/08/2017, 8:09 AM

## 2017-10-10 ENCOUNTER — Encounter (HOSPITAL_COMMUNITY): Payer: Self-pay | Admitting: Neurosurgery

## 2017-10-10 MED FILL — Thrombin (Recombinant) For Soln 20000 Unit: CUTANEOUS | Qty: 1 | Status: AC

## 2018-01-09 ENCOUNTER — Other Ambulatory Visit: Payer: Self-pay | Admitting: Orthopedic Surgery

## 2018-01-09 ENCOUNTER — Other Ambulatory Visit: Payer: Self-pay

## 2018-01-09 ENCOUNTER — Encounter (HOSPITAL_BASED_OUTPATIENT_CLINIC_OR_DEPARTMENT_OTHER): Payer: Self-pay | Admitting: *Deleted

## 2018-01-24 ENCOUNTER — Other Ambulatory Visit: Payer: Self-pay

## 2018-01-24 ENCOUNTER — Ambulatory Visit (HOSPITAL_BASED_OUTPATIENT_CLINIC_OR_DEPARTMENT_OTHER): Payer: 59 | Admitting: Anesthesiology

## 2018-01-24 ENCOUNTER — Encounter (HOSPITAL_BASED_OUTPATIENT_CLINIC_OR_DEPARTMENT_OTHER)
Admission: RE | Disposition: A | Discharge status: Home / Self Care | Payer: Self-pay | Source: Ambulatory Visit | Attending: Orthopedic Surgery

## 2018-01-24 ENCOUNTER — Ambulatory Visit (HOSPITAL_BASED_OUTPATIENT_CLINIC_OR_DEPARTMENT_OTHER)
Admission: RE | Admit: 2018-01-24 | Discharge: 2018-01-24 | Disposition: A | Discharge status: Home / Self Care | Payer: 59 | Source: Ambulatory Visit | Attending: Orthopedic Surgery | Admitting: Orthopedic Surgery

## 2018-01-24 ENCOUNTER — Encounter (HOSPITAL_BASED_OUTPATIENT_CLINIC_OR_DEPARTMENT_OTHER): Payer: Self-pay

## 2018-01-24 DIAGNOSIS — E785 Hyperlipidemia, unspecified: Secondary | ICD-10-CM | POA: Diagnosis not present

## 2018-01-24 DIAGNOSIS — M720 Palmar fascial fibromatosis [Dupuytren]: Secondary | ICD-10-CM | POA: Insufficient documentation

## 2018-01-24 DIAGNOSIS — Z87891 Personal history of nicotine dependence: Secondary | ICD-10-CM | POA: Insufficient documentation

## 2018-01-24 DIAGNOSIS — I10 Essential (primary) hypertension: Secondary | ICD-10-CM | POA: Insufficient documentation

## 2018-01-24 DIAGNOSIS — G4733 Obstructive sleep apnea (adult) (pediatric): Secondary | ICD-10-CM | POA: Diagnosis not present

## 2018-01-24 DIAGNOSIS — E039 Hypothyroidism, unspecified: Secondary | ICD-10-CM | POA: Diagnosis not present

## 2018-01-24 DIAGNOSIS — Z981 Arthrodesis status: Secondary | ICD-10-CM | POA: Insufficient documentation

## 2018-01-24 DIAGNOSIS — Z888 Allergy status to other drugs, medicaments and biological substances status: Secondary | ICD-10-CM | POA: Insufficient documentation

## 2018-01-24 DIAGNOSIS — M109 Gout, unspecified: Secondary | ICD-10-CM | POA: Diagnosis not present

## 2018-01-24 HISTORY — DX: Palmar fascial fibromatosis (dupuytren): M72.0

## 2018-01-24 HISTORY — DX: Sleep apnea, unspecified: G47.30

## 2018-01-24 HISTORY — PX: FASCIECTOMY: SHX6525

## 2018-01-24 SURGERY — FASCIECTOMY, PALM
Anesthesia: Regional | Site: Hand | Laterality: Right

## 2018-01-24 MED ORDER — FENTANYL CITRATE (PF) 100 MCG/2ML IJ SOLN: 25.0000 ug | INTRAMUSCULAR | Status: DC | PRN

## 2018-01-24 MED ORDER — HYDROCODONE-ACETAMINOPHEN 5-325 MG PO TABS: 1.0000 | ORAL_TABLET | Freq: Four times a day (QID) | ORAL | 0 refills | Status: AC | PRN

## 2018-01-24 MED ORDER — FENTANYL CITRATE (PF) 100 MCG/2ML IJ SOLN
INTRAMUSCULAR | Status: AC
  Filled 2018-01-24: qty 2

## 2018-01-24 MED ORDER — MIDAZOLAM HCL 2 MG/2ML IJ SOLN
1.0000 mg | INTRAMUSCULAR | Status: DC | PRN
  Administered 2018-01-24: 2 mg via INTRAVENOUS

## 2018-01-24 MED ORDER — PROMETHAZINE HCL 25 MG/ML IJ SOLN: 6.2500 mg | INTRAMUSCULAR | Status: DC | PRN

## 2018-01-24 MED ORDER — LACTATED RINGERS IV SOLN
INTRAVENOUS | Status: DC
  Administered 2018-01-24: 11:00:00 via INTRAVENOUS

## 2018-01-24 MED ORDER — ONDANSETRON HCL 4 MG/2ML IJ SOLN
INTRAMUSCULAR | Status: DC | PRN
  Administered 2018-01-24: 4 mg via INTRAVENOUS

## 2018-01-24 MED ORDER — ACETAMINOPHEN 10 MG/ML IV SOLN: 1000.0000 mg | Freq: Once | INTRAVENOUS | Status: DC | PRN

## 2018-01-24 MED ORDER — EPHEDRINE SULFATE-NACL 50-0.9 MG/10ML-% IV SOSY
PREFILLED_SYRINGE | INTRAVENOUS | Status: DC | PRN
  Administered 2018-01-24 (×2): 10 mg via INTRAVENOUS

## 2018-01-24 MED ORDER — SCOPOLAMINE 1 MG/3DAYS TD PT72: 1.0000 | MEDICATED_PATCH | Freq: Once | TRANSDERMAL | Status: DC | PRN

## 2018-01-24 MED ORDER — ONDANSETRON HCL 4 MG/2ML IJ SOLN
INTRAMUSCULAR | Status: AC
  Filled 2018-01-24: qty 2

## 2018-01-24 MED ORDER — OXYCODONE HCL 5 MG PO TABS: 5.0000 mg | ORAL_TABLET | Freq: Once | ORAL | Status: DC | PRN

## 2018-01-24 MED ORDER — PROPOFOL 10 MG/ML IV BOLUS
INTRAVENOUS | Status: DC | PRN
  Administered 2018-01-24: 200 mg via INTRAVENOUS

## 2018-01-24 MED ORDER — MIDAZOLAM HCL 2 MG/2ML IJ SOLN
INTRAMUSCULAR | Status: AC
  Filled 2018-01-24: qty 2

## 2018-01-24 MED ORDER — THROMBIN 20000 UNITS EX SOLR
CUTANEOUS | Status: DC | PRN
  Administered 2018-01-24: 5000 [IU] via TOPICAL

## 2018-01-24 MED ORDER — BUPIVACAINE-EPINEPHRINE (PF) 0.5% -1:200000 IJ SOLN
INTRAMUSCULAR | Status: DC | PRN
  Administered 2018-01-24: 30 mL via PERINEURAL

## 2018-01-24 MED ORDER — PROPOFOL 500 MG/50ML IV EMUL
INTRAVENOUS | Status: DC | PRN
  Administered 2018-01-24: 50 ug/kg/min via INTRAVENOUS

## 2018-01-24 MED ORDER — EPHEDRINE 5 MG/ML INJ
INTRAVENOUS | Status: AC
  Filled 2018-01-24: qty 10

## 2018-01-24 MED ORDER — OXYCODONE HCL 5 MG/5ML PO SOLN: 5.0000 mg | Freq: Once | ORAL | Status: DC | PRN

## 2018-01-24 MED ORDER — CEFAZOLIN SODIUM-DEXTROSE 2-4 GM/100ML-% IV SOLN
2.0000 g | INTRAVENOUS | Status: AC
  Administered 2018-01-24: 2 g via INTRAVENOUS

## 2018-01-24 MED ORDER — FENTANYL CITRATE (PF) 100 MCG/2ML IJ SOLN
50.0000 ug | INTRAMUSCULAR | Status: DC | PRN
  Administered 2018-01-24: 50 ug via INTRAVENOUS

## 2018-01-24 MED ORDER — CHLORHEXIDINE GLUCONATE 4 % EX LIQD: 60.0000 mL | Freq: Once | CUTANEOUS | Status: DC

## 2018-01-24 SURGICAL SUPPLY — 44 items
BLADE MINI RND TIP GREEN BEAV (BLADE) ×3 IMPLANT
BLADE SURG 15 STRL LF DISP TIS (BLADE) ×1 IMPLANT
BLADE SURG 15 STRL SS (BLADE) ×2
BNDG COHESIVE 3X5 TAN STRL LF (GAUZE/BANDAGES/DRESSINGS) ×3 IMPLANT
BNDG ESMARK 4X9 LF (GAUZE/BANDAGES/DRESSINGS) ×3 IMPLANT
BNDG GAUZE ELAST 4 BULKY (GAUZE/BANDAGES/DRESSINGS) ×3 IMPLANT
CHLORAPREP W/TINT 26ML (MISCELLANEOUS) ×3 IMPLANT
CORD BIPOLAR FORCEPS 12FT (ELECTRODE) ×3 IMPLANT
COVER BACK TABLE 60X90IN (DRAPES) ×3 IMPLANT
COVER MAYO STAND STRL (DRAPES) ×3 IMPLANT
COVER WAND RF STERILE (DRAPES) IMPLANT
CUFF TOURNIQUET SINGLE 18IN (TOURNIQUET CUFF) ×3 IMPLANT
DECANTER SPIKE VIAL GLASS SM (MISCELLANEOUS) IMPLANT
DRAPE EXTREMITY T 121X128X90 (DRAPE) ×3 IMPLANT
DRAPE SURG 17X23 STRL (DRAPES) ×3 IMPLANT
GAUZE SPONGE 4X4 12PLY STRL (GAUZE/BANDAGES/DRESSINGS) ×3 IMPLANT
GAUZE XEROFORM 1X8 LF (GAUZE/BANDAGES/DRESSINGS) ×3 IMPLANT
GLOVE BIO SURGEON STRL SZ 6.5 (GLOVE) ×2 IMPLANT
GLOVE BIO SURGEONS STRL SZ 6.5 (GLOVE) ×1
GLOVE BIOGEL PI IND STRL 7.0 (GLOVE) ×2 IMPLANT
GLOVE BIOGEL PI IND STRL 8.5 (GLOVE) ×1 IMPLANT
GLOVE BIOGEL PI INDICATOR 7.0 (GLOVE) ×4
GLOVE BIOGEL PI INDICATOR 8.5 (GLOVE) ×2
GLOVE SURG ORTHO 8.0 STRL STRW (GLOVE) ×3 IMPLANT
GOWN STRL REUS W/ TWL LRG LVL3 (GOWN DISPOSABLE) ×1 IMPLANT
GOWN STRL REUS W/TWL LRG LVL3 (GOWN DISPOSABLE) ×2
GOWN STRL REUS W/TWL XL LVL3 (GOWN DISPOSABLE) ×3 IMPLANT
LOOP VESSEL MAXI BLUE (MISCELLANEOUS) ×3 IMPLANT
NEEDLE PRECISIONGLIDE 27X1.5 (NEEDLE) ×3 IMPLANT
NS IRRIG 1000ML POUR BTL (IV SOLUTION) ×3 IMPLANT
PACK BASIN DAY SURGERY FS (CUSTOM PROCEDURE TRAY) ×3 IMPLANT
PAD CAST 3X4 CTTN HI CHSV (CAST SUPPLIES) ×1 IMPLANT
PADDING CAST COTTON 3X4 STRL (CAST SUPPLIES) ×2
SLEEVE SCD COMPRESS KNEE MED (MISCELLANEOUS) ×3 IMPLANT
SLING ARM FOAM STRAP LRG (SOFTGOODS) ×3 IMPLANT
SPLINT PLASTER CAST XFAST 3X15 (CAST SUPPLIES) ×10 IMPLANT
SPLINT PLASTER XTRA FASTSET 3X (CAST SUPPLIES) ×20
STOCKINETTE 4X48 STRL (DRAPES) ×3 IMPLANT
SUT ETHILON 4 0 PS 2 18 (SUTURE) ×3 IMPLANT
SUT SILK 2 0 PERMA HAND 18 BK (SUTURE) ×3 IMPLANT
SYR BULB 3OZ (MISCELLANEOUS) ×3 IMPLANT
SYR CONTROL 10ML LL (SYRINGE) ×3 IMPLANT
TOWEL GREEN STERILE FF (TOWEL DISPOSABLE) ×6 IMPLANT
UNDERPAD 30X30 (UNDERPADS AND DIAPERS) ×3 IMPLANT

## 2018-01-24 NOTE — Op Note (Signed)
NAME: Nathaniel Rojas MEDICAL RECORD NO: [id number] DATE OF BIRTH: 05/12/1955 FACILITY: [facility] LOCATION: [facility] PHYSICIAN: Nicki Reaper, MD   OPERATIVE REPORT   DATE OF PROCEDURE: 01/24/18    PREOPERATIVE DIAGNOSIS:   Dupuytren's contracture right small finger   POSTOPERATIVE DIAGNOSIS:   Same   PROCEDURE:   Fasciectomy right small finger   SURGEON: Cindee Salt, M.D.   ASSISTANT: none   ANESTHESIA:  General with regional   INTRAVENOUS FLUIDS:  Per anesthesia flow sheet.   ESTIMATED BLOOD LOSS:  Minimal.   COMPLICATIONS:  None.   SPECIMENS:   Palmar fascia   TOURNIQUET TIME:    Total Tourniquet Time Documented: Upper Arm (Right) - 43 minutes Total: Upper Arm (Right) - 43 minutes    DISPOSITION:  Stable to PACU.   INDICATIONS: Patient is a 62 year old male with a history of Dupuytren's contracture with a flexion deformity to his right small finger.  He is aware of multiple procedures available for treatment of this and has elected fasciectomy.  Pre-peri-postoperative course been discussed along with risks and complications.  He is aware that there is no guarantee to the surgery the possibility of infection recurrence injury to arteries nerves tendons complete relief symptoms distally possibility loss of the finger.  Area of recurrence of the flexion deformity.  Preoperative area the patient is seen extremity marked by both patient and surgeon antibiotic given.  OPERATIVE COURSE: Patient is brought to the operating room following a supraclavicular block in the preoperative area under the direction the anesthesia department.  Was prepped and draped using ChloraPrep in the supine position with the right arm free.  A three-minute dry time was allowed timeout taken to confirm patient procedure.  He continued to have feeling in the right hand was elected to undergo general anesthetic which was done by the anesthesia department.  Was exsanguinated with an Esmarch  bandage turn placed on the arm was inflated to 250 mmHg.  A volar Bruner incision was made in the palm to the middle phalanx of his right small finger this was carried down the proximal to distal direction identifying neurovascular bundles proximally.  The palmar fascia was then transected proximally to the ring and small finger.  The cord was then followed distally with insertion at the A2 pulley protection to the neurovascular bundle was afforded and throughout this portion of the procedure and through the entire procedure.  The cord was then followed distally and amatory component was transected the cord had a large bulbous lesion at the middle phalanx which was gently dissected free from the flexor sheath skin and the neurovascular bundles radially and ulnarly.  This was finally transected at its insertion of the middle phalanx and allowed complete removal of the cord.  The finger came entirely straight at both MP and interphalangeal joints.  Wound was copiously irrigated with saline saline..  Bleeders were electrocauterized necessary with bipolar.  A bit was then instilled throughout the wound.  The views were converted to wise with sharp dissection protecting the neurovascular bundles.  A double over vessel loop drain was placed at the base of the wound and the wound closed with interrupted 4 nylon sutures.  A sterile compressive dressing was applied.  The tourniquet deflated the finger immediately pink.  A dorsal splint was applied.  He was taken to the recovery room for observation in satisfactory condition.  He will be discharged home to return [facility] in 1 week on Norco.  He will  try ibuprofen Tylenol first.  He will have the Norco for breakthrough.   Cindee Salt, MD Electronically signed, 01/24/18

## 2018-01-24 NOTE — H&P (Signed)
Nathaniel Rojas is an 62 y.o. male.   Chief Complaint: contracture right small fingerHPI: Nathaniel Rojas is a 62 year old right-hand-dominant male referred by Dr. Aida Puffer for consultation regarding pain in his thumbs bilaterally left much greater than right the left has been going on for a year the right is just beginning. It is a sharp pain in the basilar area of his thumb with a VAS score 10/10 when it occurs. It does not radiate or proximally or distally. Is not complain of any numbness or tingling. He states gripping seems to make this worse. 9 use makes it better. He recalls no history of injury. Is not had any treatment for this. Does have a history of a C3-4 disc. He has a history of thyroid problems and gout. He has no history of diabetes or arthritis. Family history is negative for each of these also. He does not know where his ancestors are from. He has siblings and parents without problems with her fingers. He has no lumps on his feet or penis. He does have have the inability to fully extend his right small finger at the metacarpal phalangeal joint with an eye mass in the palm.He feels he would like to have the Dupuytren's surgically dealt with. Is not complaining of significant pain to the [facility] joints on either thumb    Past Medical History:  Diagnosis Date  . Dupuytren contracture   . Gout   . Hyperlipidemia   . Hypertension   . Hypothyroid   . Sleep apnea    mild OSA, NO CPAP    Past Surgical History:  Procedure Laterality Date  . ANTERIOR CERVICAL DECOMP/DISCECTOMY FUSION N/A 10/07/2017   Procedure: ANTERIOR CERVICAL DECOMPRESSION/DISCECTOMY FUSION CERVICAL FOUR- CERVICAL FIVE, CERVICAL FIVE- CERVICAL SIX, CERVICAL SIX- CERVICAL SEVEN;  Surgeon: Lisbeth Renshaw, MD;  Location: [facility] OR;  Service: Neurosurgery;  Laterality: N/A;  . COLONOSCOPY      History reviewed. No pertinent family history. Social History:  reports that he quit smoking about 8 years ago. His smoking use included  cigarettes. He has never used smokeless tobacco. He reports that he drinks about 2.0 standard drinks of alcohol per week. He reports that he does not use drugs.  Allergies:  Allergies  Allergen Reactions  . Niacin And Related Other (See Comments)    Flushing    No medications prior to admission.    No results found for this or any previous visit (from the past 48 hour(s)).  No results found.   Pertinent items are noted in HPI.  Height 5' 8.5" (1.74 m), weight 86.2 kg.  General appearance: alert, cooperative and appears stated age Head: Normocephalic, without obvious abnormality Neck: no JVD Resp: clear to auscultation bilaterally Cardio: regular rate and rhythm, S1, S2 normal, no murmur, click, rub or gallop GI: soft, non-tender; bowel sounds normal; no masses,  no organomegaly Extremities: contracture right small finger Pulses: 2+ and symmetric Skin: Skin color, texture, turgor normal. No rashes or lesions Neurologic: Grossly normal Incision/Wound: na  Assessment/Plan Assessment:  1. Contracture of palmar fascia  2. Primary osteoarthritis of both first carpometacarpal joints    Plan: Would like to have the Dupuytren's treated. Would recommend fasciectomy to the Dupuytren's right small finger. Pre-peri-and postoperative course been discussed along with risks and complications. He is advised there is no guarantee to the surgery possibility of infection recurrence injury to arteries nerves tendons loss of the finger recurrence of the Dupuytren's stiffness of the joints. Possibility of complex regional pain. He would  like to proceed and is scheduled for fasciectomy right small finger as an outpatient under regional anesthesia. Questions are encouraged and answered to his satisfaction.   Cindee Salt 01/24/2018, 10:38 AM

## 2018-01-24 NOTE — Discharge Instructions (Addendum)

## 2018-01-24 NOTE — Anesthesia Postprocedure Evaluation (Signed)
Anesthesia Post Note  Patient: Nathaniel Rojas  Procedure(s) Performed: PALMAR FASCIECTOMY RIGHT SMALL FINGER (Right Hand)     Patient location during evaluation: PACU Anesthesia Type: Regional and General Level of consciousness: awake and alert Pain management: pain level controlled Vital Signs Assessment: post-procedure vital signs reviewed and stable Respiratory status: spontaneous breathing, nonlabored ventilation and respiratory function stable Cardiovascular status: blood pressure returned to baseline and stable Postop Assessment: no apparent nausea or vomiting Anesthetic complications: no    Last Vitals:  Vitals:   01/24/18 1150 01/24/18 1311  BP:  113/63  Pulse: 73 73  Resp: (!) 25 14  Temp:  36.8 C  SpO2: 100% 100%    Last Pain:  Vitals:   01/24/18 1311  TempSrc:   PainSc: 0-No pain                 Kaylyn Layer

## 2018-01-24 NOTE — Transfer of Care (Signed)
Immediate Anesthesia Transfer of Care Note  Patient: Nathaniel Rojas  Procedure(s) Performed: PALMAR FASCIECTOMY RIGHT SMALL FINGER (Right Hand)  Patient Location: PACU  Anesthesia Type:GA combined with regional for post-op pain  Level of Consciousness: awake, sedated and patient cooperative  Airway & Oxygen Therapy: Patient Spontanous Breathing and Patient connected to face mask oxygen  Post-op Assessment: Report given to RN and Post -op Vital signs reviewed and stable  Post vital signs: Reviewed and stable  Last Vitals:  Vitals Value Taken Time  BP 113/63 01/24/2018  1:11 PM  Temp    Pulse 73 01/24/2018  1:13 PM  Resp 13 01/24/2018  1:13 PM  SpO2 100 % 01/24/2018  1:13 PM  Vitals shown include unvalidated device data.  Last Pain:  Vitals:   01/24/18 1108  TempSrc: Oral  PainSc: 0-No pain         Complications: No apparent anesthesia complications

## 2018-01-24 NOTE — Anesthesia Procedure Notes (Signed)
Procedure Name: LMA Insertion Date/Time: 01/24/2018 12:12 PM Performed by: Gar Gibbon, CRNA Pre-anesthesia Checklist: Patient identified, Emergency Drugs available, Suction available and Patient being monitored Patient Re-evaluated:Patient Re-evaluated prior to induction Oxygen Delivery Method: Circle system utilized Preoxygenation: Pre-oxygenation with 100% oxygen Induction Type: IV induction Ventilation: Mask ventilation without difficulty LMA: LMA inserted LMA Size: 4.0 Number of attempts: 1 Airway Equipment and Method: Bite block Placement Confirmation: positive ETCO2 Tube secured with: Tape Dental Injury: Teeth and Oropharynx as per pre-operative assessment

## 2018-01-24 NOTE — Brief Op Note (Signed)
01/24/2018  1:16 PM  PATIENT:  Nathaniel Rojas  62 y.o. male  PRE-OPERATIVE DIAGNOSIS:  DUPUYTRENS CONTRACTURE RIGHT SMALL FINGER  POST-OPERATIVE DIAGNOSIS:  DUPUYTRENS CONTRACTURE RIGHT SMALL FINGER  PROCEDURE:  Procedure(s): PALMAR FASCIECTOMY RIGHT SMALL FINGER (Right)  SURGEON:  Surgeon(s) and Role:    * Cindee Salt, MD - Primary  PHYSICIAN ASSISTANT:   ASSISTANTS: none   ANESTHESIA:   regional and general  EBL:  2ml BLOOD ADMINISTERED:none  DRAINS: Penrose drain in the hand   LOCAL MEDICATIONS USED:  NONE  SPECIMEN:  Excision  DISPOSITION OF SPECIMEN:  N/A  COUNTS:  YES  TOURNIQUET:   Total Tourniquet Time Documented: Upper Arm (Right) - 43 minutes Total: Upper Arm (Right) - 43 minutes   DICTATION: .Reubin Milan Dictation  PLAN OF CARE: Discharge to home after PACU  PATIENT DISPOSITION:  PACU - hemodynamically stable.

## 2018-01-24 NOTE — Anesthesia Procedure Notes (Addendum)
Anesthesia Regional Block: Supraclavicular block   Pre-Anesthetic Checklist: ,, timeout performed, Correct Patient, Correct Site, Correct Laterality, Correct Procedure, Correct Position, site marked, Risks and benefits discussed, pre-op evaluation,  At surgeon's request and post-op pain management  Laterality: Right  Prep: Maximum Sterile Barrier Precautions used, chloraprep       Needles:  Injection technique: Single-shot  Needle Type: Echogenic Stimulator Needle     Needle Length: 9cm  Needle Gauge: 22     Additional Needles:   Procedures:,,,, ultrasound used (permanent image in chart),,,,  Narrative:  Start time: 01/24/2018 11:40 AM End time: 01/24/2018 11:42 AM Injection made incrementally with aspirations every 5 mL.  Performed by: Personally  Anesthesiologist: Kaylyn Layer, MD  Additional Notes: Risks, benefits, and alternative discussed. Patient gave consent for procedure. Patient prepped and draped in sterile fashion. Sedation administered, patient remains easily responsive to voice. Relevant anatomy identified with ultrasound guidance. Local anesthetic given in 5cc increments with no signs or symptoms of intravascular injection. No pain or paraesthesias with injection. Patient monitored throughout procedure with signs of LAST or immediate complications. Tolerated well. Ultrasound image placed in chart.  Amalia Greenhouse, MD

## 2018-01-24 NOTE — Anesthesia Preprocedure Evaluation (Addendum)
Anesthesia Evaluation  Patient identified by MRN, date of birth, ID band Patient awake    Reviewed: Allergy & Precautions, NPO status , Patient's Chart, lab work & pertinent test results, reviewed documented beta blocker date and time   History of Anesthesia Complications Negative for: history of anesthetic complications  Airway Mallampati: II  TM Distance: >3 FB Neck ROM: Full    Dental no notable dental hx. (+) Teeth Intact   Pulmonary sleep apnea , former smoker,    Pulmonary exam normal breath sounds clear to auscultation       Cardiovascular hypertension, Pt. on medications and Pt. on home beta blockers Normal cardiovascular exam Rhythm:Regular Rate:Normal     Neuro/Psych negative neurological ROS  negative psych ROS   GI/Hepatic negative GI ROS, Neg liver ROS,   Endo/Other  Hypothyroidism   Renal/GU negative Renal ROS     Musculoskeletal negative musculoskeletal ROS (+)   Abdominal   Peds  Hematology negative hematology ROS (+)   Anesthesia Other Findings   Reproductive/Obstetrics negative OB ROS                            Anesthesia Physical Anesthesia Plan  ASA: II  Anesthesia Plan: Regional   Post-op Pain Management:    Induction: Intravenous  PONV Risk Score and Plan: 1 and Treatment may vary due to age or medical condition, Propofol infusion and Ondansetron  Airway Management Planned: Natural Airway and Nasal Cannula  Additional Equipment:   Intra-op Plan:   Post-operative Plan:   Informed Consent: I have reviewed the patients History and Physical, chart, labs and discussed the procedure including the risks, benefits and alternatives for the proposed anesthesia with the patient or authorized representative who has indicated his/her understanding and acceptance.   Dental advisory given  Plan Discussed with: CRNA  Anesthesia Plan Comments:         Anesthesia Quick Evaluation

## 2018-01-25 ENCOUNTER — Encounter (HOSPITAL_BASED_OUTPATIENT_CLINIC_OR_DEPARTMENT_OTHER): Payer: Self-pay | Admitting: Orthopedic Surgery

## 2018-01-31 ENCOUNTER — Other Ambulatory Visit: Payer: Self-pay | Admitting: Neurosurgery

## 2018-01-31 DIAGNOSIS — R221 Localized swelling, mass and lump, neck: Secondary | ICD-10-CM

## 2018-02-02 ENCOUNTER — Ambulatory Visit
Admission: RE | Admit: 2018-02-02 | Discharge: 2018-02-02 | Disposition: A | Discharge status: Home / Self Care | Payer: 59 | Source: Ambulatory Visit | Attending: Neurosurgery | Admitting: Neurosurgery

## 2018-02-02 DIAGNOSIS — R221 Localized swelling, mass and lump, neck: Secondary | ICD-10-CM

## 2018-11-04 IMAGING — MR MR LUMBAR SPINE W/O CM
4 of 5 series · 25 of 48 positions shown · non-contrast
Comparison: Prior radiograph from 01/12/2016.

CLINICAL DATA: Initial evaluation for right leg pain and forming
with burning and numbness pain both feet for 1 year.

EXAM:
MRI LUMBAR SPINE WITHOUT CONTRAST
TECHNIQUE: Multiplanar, multisequence MR imaging of the lumbar spine was
performed. No intravenous contrast was administered.

[Series 4: T1 · sagittal · 4.0mm · 0.55mm/px · 6 of 13 slices shown (1 of 2)]
[im 1/13]
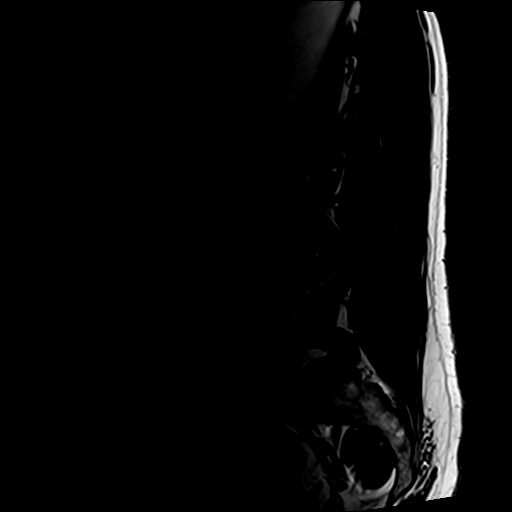
[im 3/13]
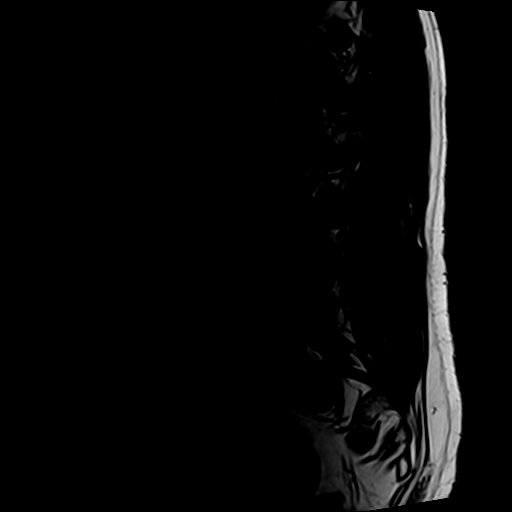
[im 5/13]
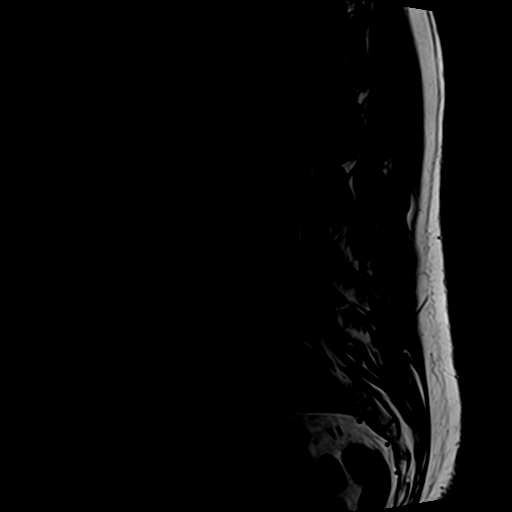
[im 8/13]
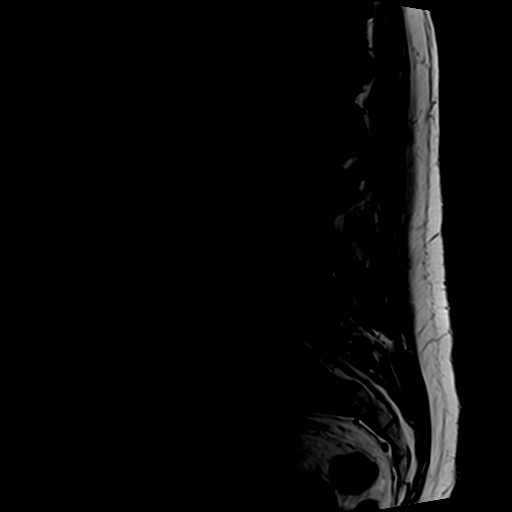
[im 10/13]
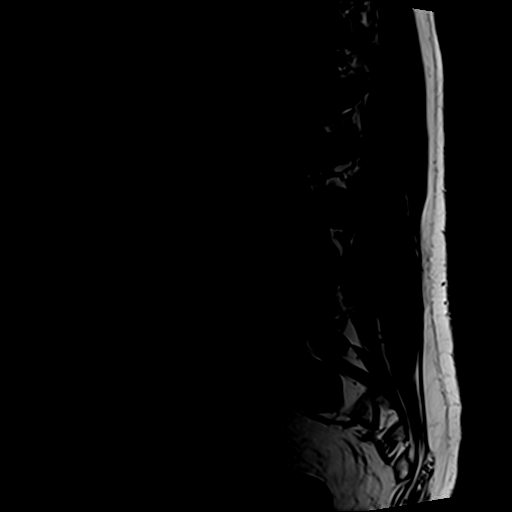
[im 13/13]
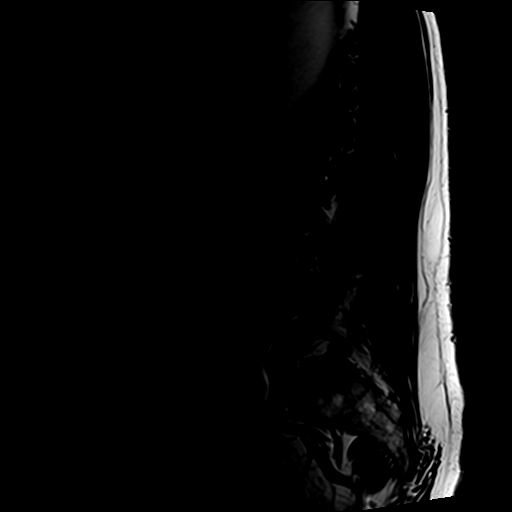

[Series 5: T2 post-contrast · sagittal · 4.0mm · 0.55mm/px · 6 of 13 slices shown]
[im 1/13]
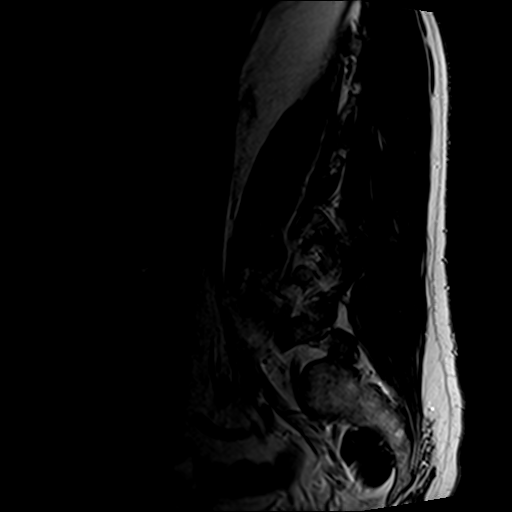
[im 3/13]
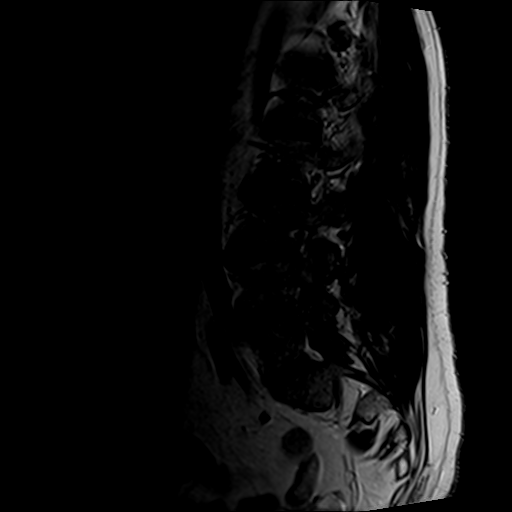
[im 5/13]
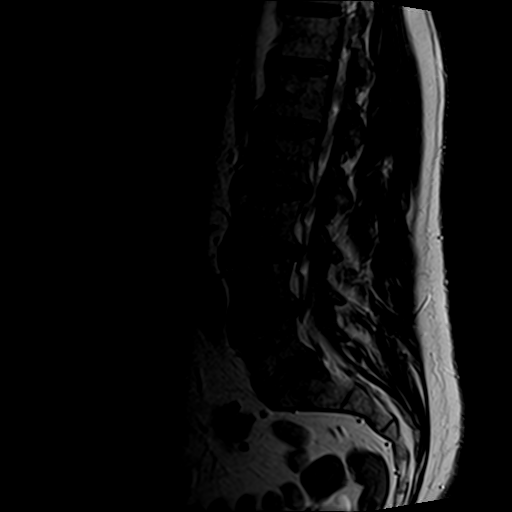
[im 8/13]
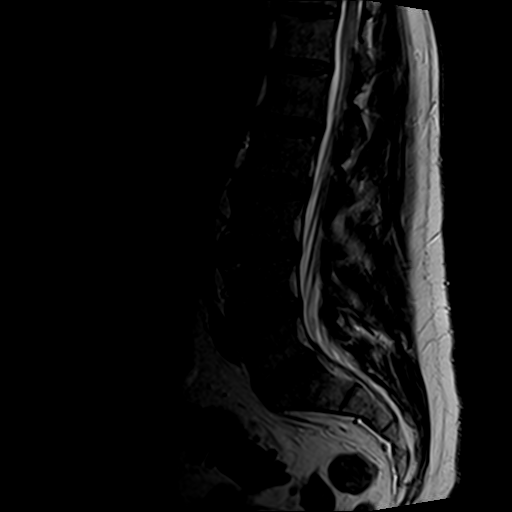
[im 10/13]
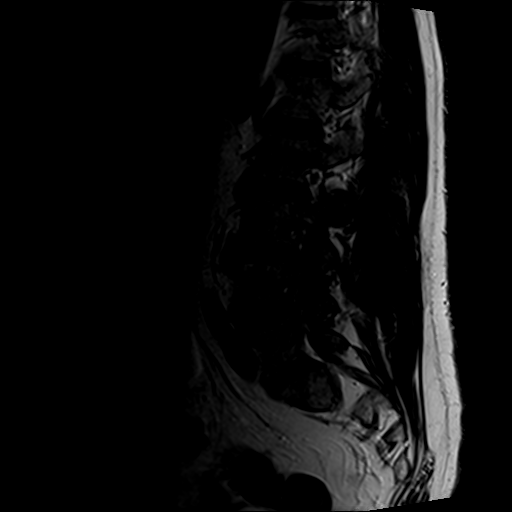
[im 13/13]
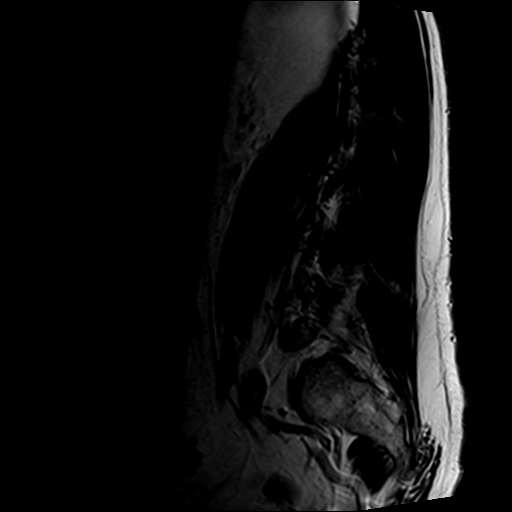

[Series 6: T2 · axial · 4.0mm · 0.70mm/px · z∈[-84,+82]mm · 9 of 32 slices shown]
[im 1/32]
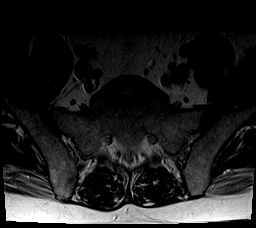
[im 5/32]
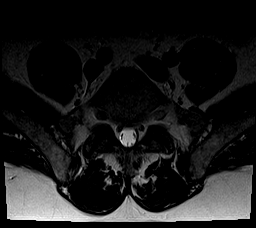
[im 9/32]
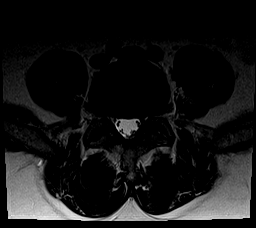
[im 14/32]
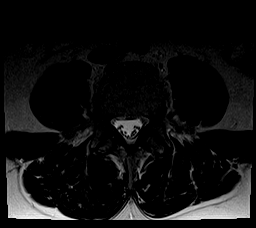
[im 16/32]
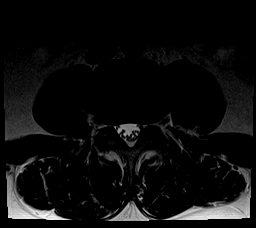
[im 18/32]
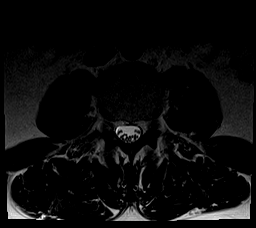
[im 23/32]
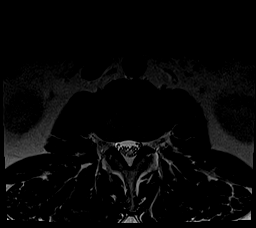
[im 27/32]
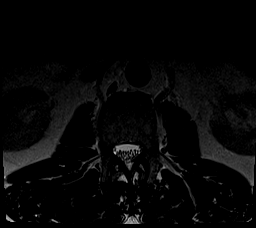
[im 32/32]
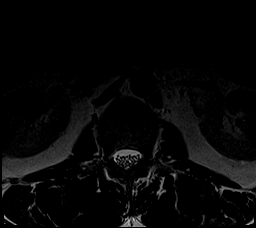

[Series 7: T1 · axial · 4.0mm · 0.35mm/px · z∈[-84,+58]mm · 4 of 32 slices shown (2 of 2)]
[im 1/32]
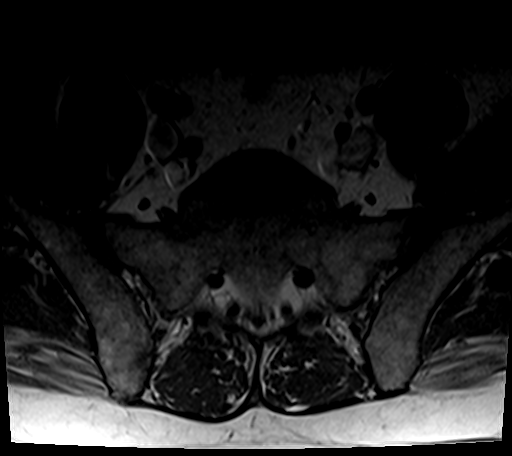
[im 5/32]
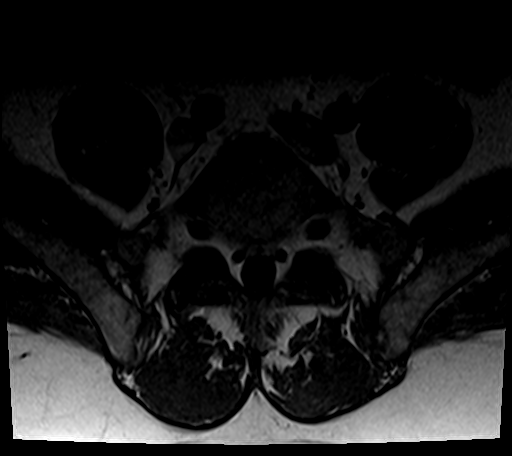
[im 16/32]
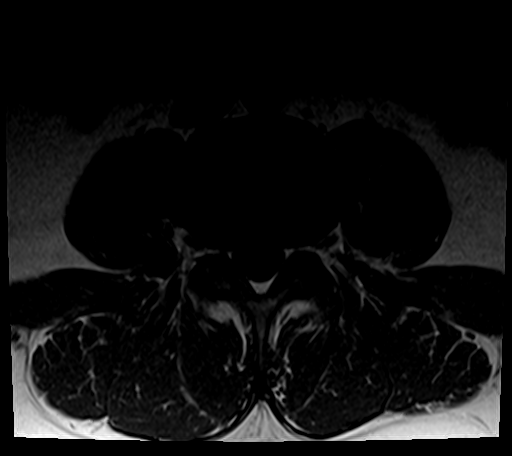
[im 27/32]
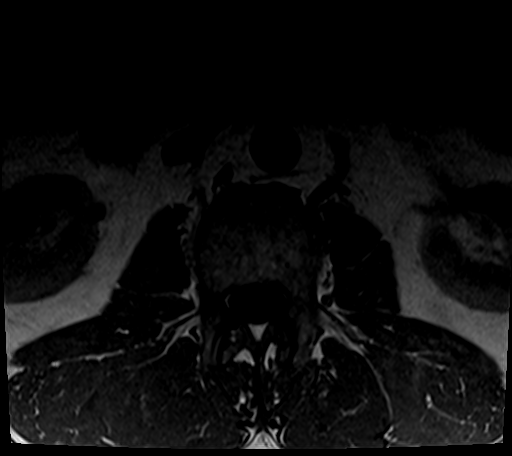

[25 of 48 positions shown; findings below may reference images not displayed]

FINDINGS: Segmentation: Normal segmentation. Lowest well-formed disc is
labeled the L5-S1 level.

Alignment: Vertebral bodies normally aligned with preservation of
the normal lumbar lordosis. No listhesis.

Vertebrae: Vertebral body heights preserved. No evidence for acute
or chronic fracture. Small degenerative endplate Schmorl's node
noted at the superior endplate of T12. Signal intensity within the
vertebral body bone marrow is normal. No abnormal marrow edema. No
worrisome osseous lesions.

Conus medullaris: Extends to the L1 level and appears normal.

Paraspinal and other soft tissues: Paraspinous soft tissues within
normal limits. Visualized visceral structures are unremarkable.

Disc levels:

L1-2:  Negative.

L2-3: Mild diffuse disc bulge with disc desiccation. No significant
stenosis.

L3-4: Mild diffuse disc bulge with disc desiccation and
intervertebral disc space narrowing. Disc bulging is fairly
circumferential without focal disc protrusion. Mild bilateral facet
arthrosis. No significant canal stenosis. Mild bilateral foraminal
narrowing related to disc bulge and facet disease.

L4-5: Mild diffuse disc bulge with disc desiccation. Mild bilateral
facet arthrosis. No significant canal stenosis. Moderate bilateral
foraminal narrowing related to disc bulge and facet disease. Next
detailed no disc bulge or disc protrusion. Mild facet arthropathy,
greater on the right. No stenosis.

L5-S1: No disc bulge or disc protrusion. Minimal bilateral facet
arthrosis. No significant canal or foraminal stenosis.
IMPRESSION: 1. Mild degenerative disc bulging and facet arthrosis at L3-4 and
L4-5 with resultant mild to moderate foraminal stenosis (greater at
L4-5).
2. Mild degenerative spondylolysis at L2-3 without significant
stenosis or evidence for neural impingement.
3. Otherwise negative MRI of the lumbar spine. No other significant
degenerative changes identified. No significant canal stenosis. No
evidence for neural impingement.

## 2019-01-30 ENCOUNTER — Other Ambulatory Visit: Payer: Self-pay

## 2019-01-30 DIAGNOSIS — Z20822 Contact with and (suspected) exposure to covid-19: Secondary | ICD-10-CM

## 2019-02-01 LAB — NOVEL CORONAVIRUS, NAA: SARS-CoV-2, NAA: NOT DETECTED

## 2019-06-06 ENCOUNTER — Other Ambulatory Visit: Payer: Self-pay | Admitting: Neurosurgery

## 2019-06-06 DIAGNOSIS — M5136 Other intervertebral disc degeneration, lumbar region: Secondary | ICD-10-CM

## 2019-07-06 ENCOUNTER — Ambulatory Visit
Admission: RE | Admit: 2019-07-06 | Discharge: 2019-07-06 | Disposition: A | Discharge status: Home / Self Care | Payer: 59 | Source: Ambulatory Visit | Attending: Neurosurgery | Admitting: Neurosurgery

## 2019-07-06 ENCOUNTER — Other Ambulatory Visit: Payer: Self-pay

## 2019-07-06 DIAGNOSIS — M5136 Other intervertebral disc degeneration, lumbar region: Secondary | ICD-10-CM

## 2019-07-17 ENCOUNTER — Other Ambulatory Visit: Payer: Self-pay | Admitting: Otolaryngology

## 2019-07-19 ENCOUNTER — Other Ambulatory Visit: Payer: Self-pay | Admitting: Neurosurgery

## 2019-07-19 DIAGNOSIS — S43422A Sprain of left rotator cuff capsule, initial encounter: Secondary | ICD-10-CM

## 2019-08-15 ENCOUNTER — Ambulatory Visit
Admission: RE | Admit: 2019-08-15 | Discharge: 2019-08-15 | Disposition: A | Discharge status: Home / Self Care | Payer: 59 | Source: Ambulatory Visit | Attending: Neurosurgery | Admitting: Neurosurgery

## 2019-08-15 ENCOUNTER — Other Ambulatory Visit: Payer: Self-pay

## 2019-08-15 DIAGNOSIS — S43422A Sprain of left rotator cuff capsule, initial encounter: Secondary | ICD-10-CM

## 2022-02-03 ENCOUNTER — Other Ambulatory Visit: Payer: Self-pay | Admitting: Physician Assistant

## 2022-02-03 DIAGNOSIS — Z87891 Personal history of nicotine dependence: Secondary | ICD-10-CM

## 2022-02-15 ENCOUNTER — Other Ambulatory Visit: Payer: Self-pay | Admitting: Physician Assistant

## 2022-02-15 DIAGNOSIS — I6529 Occlusion and stenosis of unspecified carotid artery: Secondary | ICD-10-CM

## 2022-03-02 ENCOUNTER — Ambulatory Visit
Admission: RE | Admit: 2022-03-02 | Discharge: 2022-03-02 | Disposition: A | Discharge status: Home / Self Care | Payer: 59 | Source: Ambulatory Visit | Attending: Physician Assistant | Admitting: Physician Assistant

## 2022-03-02 DIAGNOSIS — I6529 Occlusion and stenosis of unspecified carotid artery: Secondary | ICD-10-CM

## 2022-03-10 ENCOUNTER — Ambulatory Visit
Admission: RE | Admit: 2022-03-10 | Discharge: 2022-03-10 | Disposition: A | Discharge status: Home / Self Care | Payer: 59 | Source: Ambulatory Visit | Attending: Physician Assistant | Admitting: Physician Assistant

## 2022-03-10 DIAGNOSIS — Z87891 Personal history of nicotine dependence: Secondary | ICD-10-CM

## 2022-03-15 NOTE — Progress Notes (Unsigned)
[facility] [place]  ASSESSMENT / PLAN: Nathaniel Rojas is a 66 y.o. male with asymptomatic bilateral mild (1-49%) carotid artery stenosis.   The patient should continue best medical therapy for carotid artery stenosis including: Complete cessation from all tobacco products. Blood glucose control with goal A1c < 7%. Blood pressure control with goal blood pressure < 140/90 mmHg. Lipid reduction therapy with goal LDL-C <100 mg/dL (<70 if symptomatic from carotid artery stenosis).  Aspirin 81mg  PO QD.  Atorvastatin 40-80mg  PO QD (or other "high intensity" statin therapy).  Follow up with me in 2 years with repeat carotid artery duplex.  CHIEF COMPLAINT: Incidental discovery of carotid artery stenosis  HISTORY OF PRESENT ILLNESS: Nathaniel Rojas is a 66 y.o. male for to clinic for evaluation of carotid artery stenosis bilaterally.  The patient had carotid artery disease first identified during a stress test several years ago.  The patient has been asymptomatic from a coronary and carotid artery disease standpoint since then.  He had a repeat carotid duplex performed by his primary care PA, and, was referred for further evaluation.  Past Medical History:  Diagnosis Date   Dupuytren contracture    Gout    Hyperlipidemia    Hypertension    Hypothyroid    Sleep apnea    mild OSA, NO CPAP    Past Surgical History:  Procedure Laterality Date   ANTERIOR CERVICAL DECOMP/DISCECTOMY FUSION N/A 10/07/2017   Procedure: ANTERIOR CERVICAL DECOMPRESSION/DISCECTOMY FUSION CERVICAL FOUR- CERVICAL FIVE, CERVICAL FIVE- CERVICAL SIX, CERVICAL SIX- CERVICAL SEVEN;  Surgeon: 10/09/2017, MD;  Location: [facility] OR;  Service: Neurosurgery;  Laterality: N/A;   COLONOSCOPY     FASCIECTOMY Right 01/24/2018   Procedure: PALMAR FASCIECTOMY RIGHT SMALL FINGER;  Surgeon: 13/07/2017, MD;  Location: [facility];  Service: Orthopedics;  Laterality: Right;    History reviewed.  No pertinent family history.  Social History   Socioeconomic History   Marital status: Married    Spouse name: Not on file   Number of children: Not on file   Years of education: Not on file   Highest education level: Not on file  Occupational History   Not on file  Tobacco Use   Smoking status: Former    Types: Cigarettes    Quit date: 03/22/2009    Years since quitting: 12.9   Smokeless tobacco: Never  Vaping Use   Vaping Use: Never used  Substance and Sexual Activity   Alcohol use: Yes    Alcohol/week: 2.0 standard drinks of alcohol    Types: 2 Cans of beer per week    Comment: social   Drug use: No   Sexual activity: Not on file  Other Topics Concern   Not on file  Social History Narrative   Not on file   Social Determinants of Health   Financial Resource Strain: Not on file  Food Insecurity: Not on file  Transportation Needs: Not on file  Physical Activity: Not on file  Stress: Not on file  Social Connections: Not on file  Intimate Partner Violence: Not on file    Allergies  Allergen Reactions   Amoxil [Amoxicillin] Other (See Comments)    GI cramping   Niacin And Related Other (See Comments)    Flushing    Current Outpatient Medications  Medication Sig Dispense Refill   allopurinol (ZYLOPRIM) 300 MG tablet Take 300 mg by mouth daily.     amLODipine (NORVASC) 10 MG tablet Take 10 mg by mouth  daily.     aspirin EC 81 MG tablet Take 81 mg by mouth daily.     bisoprolol (ZEBETA) 5 MG tablet Take 5 mg by mouth daily.     HYDROcodone-acetaminophen (NORCO) 5-325 MG tablet Take 1 tablet by mouth every 6 (six) hours as needed. 20 tablet 0   levothyroxine (SYNTHROID, LEVOTHROID) 100 MCG tablet Take 100 mcg by mouth daily before breakfast.     meloxicam (MOBIC) 7.5 MG tablet Take 7.5 mg by mouth daily.     Multiple Vitamins-Minerals (MULTIVITAMIN WITH MINERALS) tablet Take 1 tablet by mouth daily.     Omega-3 Fatty Acids (FISH OIL) 1000 MG CAPS Take 2 capsules by  mouth daily.     tamsulosin (FLOMAX) 0.4 MG CAPS capsule Take 0.4 mg by mouth daily.      No current facility-administered medications for this visit.    PHYSICAL EXAM Vitals:   03/16/22 1313 03/16/22 1315  BP: 126/81 125/77  Pulse: 70   Resp: 20   Temp: 98.2 F (36.8 C)   SpO2: 92%   Weight: 195 lb (88.5 kg)   Height: 5' 8.5" (1.74 m)     Well-appearing man in no acute distress Regular rate and rhythm Unlabored breathing No focal neurologic deficits  PERTINENT LABORATORY AND RADIOLOGIC DATA  Most recent CBC    Latest Ref Rng & Units 10/08/2017    3:14 AM 09/29/2017    9:31 AM  CBC  WBC 4.0 - 10.5 K/uL 15.9  8.1   Hemoglobin 13.0 - 17.0 g/dL 31.5  40.0   Hematocrit 39.0 - 52.0 % 36.2  41.9   Platelets 150 - 400 K/uL 168  179      Most recent CMP    Latest Ref Rng & Units 10/08/2017    3:14 AM 09/29/2017    9:31 AM  CMP  Glucose 70 - 99 mg/dL 867  619   BUN 6 - 20 mg/dL 18  19   Creatinine 5.09 - 1.24 mg/dL 3.26  7.12   Sodium 458 - 145 mmol/L 137  143   Potassium 3.5 - 5.1 mmol/L 4.3  4.0   Chloride 98 - 111 mmol/L 102  111   CO2 22 - 32 mmol/L 26  22   Calcium 8.9 - 10.3 mg/dL 8.9  9.2    Carotid duplex 03/02/22:  1. Mild (1-49%) stenosis proximal right internal carotid artery secondary to heterogenous atherosclerotic plaque. 2. Mild (1-49%) stenosis proximal left internal carotid artery secondary to heterogenous atherosclerotic plaque. 3. There is a hemodynamically significant stenosis of the right external carotid artery. 4. Vertebral arteries are patent with normal antegrade flow.  Rande Brunt. Lenell Antu, MD [facility] Phone Number: [phone] 03/16/2022 3:18 PM   Total time spent on preparing this encounter including chart review, data review, collecting history, examining the patient, coordinating care for this new patient, 45 minutes.  Portions of this report may have been transcribed using voice  recognition software.  Every effort has been made to ensure accuracy; however, inadvertent computerized transcription errors may still be present.

## 2022-03-16 ENCOUNTER — Ambulatory Visit: Payer: 59 | Admitting: Vascular Surgery

## 2022-03-16 ENCOUNTER — Encounter: Payer: Self-pay | Admitting: Vascular Surgery

## 2022-03-16 VITALS — BP 125/77 | HR 70 | Temp 98.2°F | Resp 20 | Ht 68.5 in | Wt 195.0 lb

## 2022-03-16 DIAGNOSIS — I6523 Occlusion and stenosis of bilateral carotid arteries: Secondary | ICD-10-CM | POA: Diagnosis not present

## 2022-03-25 ENCOUNTER — Encounter: Payer: 59 | Admitting: Vascular Surgery

## 2023-01-04 ENCOUNTER — Encounter (HOSPITAL_COMMUNITY): Payer: Self-pay

## 2023-01-04 ENCOUNTER — Other Ambulatory Visit: Payer: Self-pay

## 2023-01-04 ENCOUNTER — Emergency Department (HOSPITAL_COMMUNITY)
Admission: EM | Admit: 2023-01-04 | Discharge: 2023-01-04 | Disposition: A | Discharge status: Home / Self Care | Payer: Medicare HMO | Attending: Emergency Medicine | Admitting: Emergency Medicine

## 2023-01-04 DIAGNOSIS — I10 Essential (primary) hypertension: Secondary | ICD-10-CM | POA: Diagnosis not present

## 2023-01-04 DIAGNOSIS — R0602 Shortness of breath: Secondary | ICD-10-CM | POA: Diagnosis not present

## 2023-01-04 DIAGNOSIS — Z7982 Long term (current) use of aspirin: Secondary | ICD-10-CM | POA: Insufficient documentation

## 2023-01-04 DIAGNOSIS — Z79899 Other long term (current) drug therapy: Secondary | ICD-10-CM | POA: Diagnosis not present

## 2023-01-04 DIAGNOSIS — T7840XA Allergy, unspecified, initial encounter: Secondary | ICD-10-CM | POA: Insufficient documentation

## 2023-01-04 MED ORDER — FAMOTIDINE IN NACL 20-0.9 MG/50ML-% IV SOLN
20.0000 mg | Freq: Once | INTRAVENOUS | Status: AC
  Administered 2023-01-04: 20 mg via INTRAVENOUS
  Filled 2023-01-04: qty 50

## 2023-01-04 MED ORDER — METHYLPREDNISOLONE SODIUM SUCC 125 MG IJ SOLR
125.0000 mg | Freq: Once | INTRAMUSCULAR | Status: AC
  Administered 2023-01-04: 125 mg via INTRAVENOUS
  Filled 2023-01-04: qty 2

## 2023-01-04 MED ORDER — PREDNISONE 10 MG PO TABS: 40.0000 mg | ORAL_TABLET | Freq: Every day | ORAL | 0 refills | Status: AC

## 2023-01-04 MED ORDER — FAMOTIDINE 20 MG PO TABS: 20.0000 mg | ORAL_TABLET | Freq: Two times a day (BID) | ORAL | 0 refills | Status: AC

## 2023-01-04 MED ORDER — DIPHENHYDRAMINE HCL 50 MG/ML IJ SOLN
25.0000 mg | Freq: Once | INTRAMUSCULAR | Status: AC
  Administered 2023-01-04: 25 mg via INTRAVENOUS
  Filled 2023-01-04: qty 1

## 2023-01-04 MED ORDER — DIPHENHYDRAMINE HCL 25 MG PO TABS: 25.0000 mg | ORAL_TABLET | Freq: Four times a day (QID) | ORAL | 0 refills | Status: AC | PRN

## 2023-01-04 MED ORDER — LACTATED RINGERS IV BOLUS
1000.0000 mL | Freq: Once | INTRAVENOUS | Status: AC
  Administered 2023-01-04: 1000 mL via INTRAVENOUS

## 2023-01-04 NOTE — Discharge Instructions (Signed)
Prescriptions were sent to your pharmacy. Take predisone, benadryl and pepcid for the next 3 days. Return to the emergency department for any worsening of symptoms.

## 2023-01-04 NOTE — ED Triage Notes (Signed)
Pt woke up about an hour ago not being able to breathe. After sitting up he started to breathe a little easier, took 25 mg of benedryl and a bronkaid tablet. Has hives on his trunk and he still has some trouble breathing. 02 sat is 94%

## 2023-01-04 NOTE — ED Provider Notes (Signed)
[place] EMERGENCY DEPARTMENT AT [facility] Provider Note   CSN: [id number] Arrival date & time: 01/04/23  0118     History  Chief Complaint  Patient presents with   Allergic Reaction    Nathaniel Rojas is a 67 y.o. male.   Allergic Reaction Presenting symptoms: rash   Patient presents for allergic reaction.  Medical history includes HLD, HTN, sleep apnea, gout.  Approximately 1 hour prior to arrival, patient woke from sleep with shortness of breath.  He noticed pruritic hives on areas of back, axilla, chest, and buttocks.  He took 125 mg Benadryl and a Bronkaid tablet.  His shortness of breath has improved.  He denies any history of similar reactions.  Last meal was approximately 5 hours ago.  He did have some lobster with sauce.  It was the first time he had ate this meal.  He felt fine afterwards.  He went home and watching TV.  He went to sleep in his normal state of health.    Home Medications Prior to Admission medications   Medication Sig Start Date End Date Taking? Authorizing Provider  diphenhydrAMINE (BENADRYL) 25 MG tablet Take 1 tablet (25 mg total) by mouth every 6 (six) hours as needed for up to 3 days. 01/04/23 01/07/23 Yes Gloris Manchester, MD  famotidine (PEPCID) 20 MG tablet Take 1 tablet (20 mg total) by mouth 2 (two) times daily for 3 days. 01/04/23 01/07/23 Yes Gloris Manchester, MD  predniSONE (DELTASONE) 10 MG tablet Take 4 tablets (40 mg total) by mouth daily for 3 days. 01/04/23 01/07/23 Yes Gloris Manchester, MD  allopurinol (ZYLOPRIM) 300 MG tablet Take 300 mg by mouth daily.    [provider]  amLODipine (NORVASC) 10 MG tablet Take 10 mg by mouth daily.    [provider]  aspirin EC 81 MG tablet Take 81 mg by mouth daily.    [provider]  bisoprolol (ZEBETA) 5 MG tablet Take 5 mg by mouth daily.    [provider]  HYDROcodone-acetaminophen (NORCO) 5-325 MG tablet Take 1 tablet by mouth every 6 (six) hours as needed.  01/24/18   Cindee Salt, MD  levothyroxine (SYNTHROID, LEVOTHROID) 100 MCG tablet Take 100 mcg by mouth daily before breakfast.    [provider]  meloxicam (MOBIC) 7.5 MG tablet Take 7.5 mg by mouth daily.    [provider]  Multiple Vitamins-Minerals (MULTIVITAMIN WITH MINERALS) tablet Take 1 tablet by mouth daily.    [provider]  Omega-3 Fatty Acids (FISH OIL) 1000 MG CAPS Take 2 capsules by mouth daily.    [provider]  tamsulosin (FLOMAX) 0.4 MG CAPS capsule Take 0.4 mg by mouth daily.     [provider]      Allergies    Amoxil [amoxicillin] and Niacin and related    Review of Systems   Review of Systems  Respiratory:  Positive for chest tightness and shortness of breath.   Skin:  Positive for rash.  All other systems reviewed and are negative.   Physical Exam Updated Vital Signs BP 138/70   Pulse 71   Temp 98.7 F (37.1 C)   Resp 16   Ht 5\' 9"  (1.753 m)   Wt 86.2 kg   SpO2 96%   BMI 28.06 kg/m  Physical Exam Vitals and nursing note reviewed.  Constitutional:      General: He is not in acute distress.    Appearance: Normal appearance. He is well-developed. He is  not ill-appearing, toxic-appearing or diaphoretic.  HENT:     Head: Normocephalic and atraumatic.     Right Ear: External ear normal.     Left Ear: External ear normal.     Nose: Nose normal.     Mouth/Throat:     Mouth: Mucous membranes are moist.  Eyes:     Extraocular Movements: Extraocular movements intact.     Conjunctiva/sclera: Conjunctivae normal.  Cardiovascular:     Rate and Rhythm: Normal rate and regular rhythm.  Pulmonary:     Effort: Pulmonary effort is normal. No respiratory distress.     Breath sounds: Normal breath sounds. No wheezing, rhonchi or rales.  Chest:     Chest wall: No tenderness.  Abdominal:     General: There is no distension.     Palpations: Abdomen is soft.     Tenderness: There is no abdominal tenderness.   Musculoskeletal:        General: No swelling. Normal range of motion.     Cervical back: Normal range of motion and neck supple.     Right lower leg: No edema.     Left lower leg: No edema.  Skin:    General: Skin is warm and dry.     Coloration: Skin is not jaundiced or pale.     Findings: Rash (Urticaria to lateral chest wall bilaterally, axilla, and back.) present.  Neurological:     General: No focal deficit present.     Mental Status: He is alert and oriented to person, place, and time.  Psychiatric:        Mood and Affect: Mood normal.        Behavior: Behavior normal.     ED Results / Procedures / Treatments   Labs (all labs ordered are listed, but only abnormal results are displayed) Labs Reviewed - No data to display  EKG EKG Interpretation Date/Time:  Tuesday January 04 2023 01:27:54 EDT Ventricular Rate:  98 PR Interval:  184 QRS Duration:  88 QT Interval:  344 QTC Calculation: 439 R Axis:   87  Text Interpretation: Normal sinus rhythm Confirmed by Gloris Manchester ([id number]) on 01/04/2023 3:10:38 AM  Radiology No results found.  Procedures Procedures    Medications Ordered in ED Medications  famotidine (PEPCID) IVPB 20 mg premix (0 mg Intravenous Stopped 01/04/23 0214)  methylPREDNISolone sodium succinate (SOLU-MEDROL) 125 mg/2 mL injection 125 mg (125 mg Intravenous Given 01/04/23 0151)  diphenhydrAMINE (BENADRYL) injection 25 mg (25 mg Intravenous Given 01/04/23 0150)  lactated ringers bolus 1,000 mL (0 mLs Intravenous Stopped 01/04/23 0325)    ED Course/ Medical Decision Making/ A&P                                 Medical Decision Making Risk OTC drugs. Prescription drug management.   This patient presents to the ED for concern of shortness of breath, this involves an extensive number of treatment options, and is a complaint that carries with it a high risk of complications and morbidity.  The differential diagnosis includes allergic reaction,  anaphylaxis, anxiety, sleep apnea, reactive airway disease, CHF   Co morbidities that complicate the patient evaluation  HLD, HTN, sleep apnea, gout   Additional history obtained:  Additional history obtained from patient's wife External records from outside source obtained and reviewed including EMR  Cardiac Monitoring: / EKG:  The patient was maintained on a cardiac monitor.  I personally viewed  and interpreted the cardiac monitored which showed an underlying rhythm of: Sinus rhythm   Problem List / ED Course / Critical interventions / Medication management  Patient presenting for allergic reaction with unknown allergen.  Onset of symptoms 1 hour prior to arrival.  Symptoms have consisted of urticarial rash and shortness of breath.  Shortness of breath has resolved.  On arrival in the ED, he is well-appearing.  His breathing is unlabored.  No wheezing is appreciated on lung auscultation.  He does have ongoing hives to trunk.  Steroid, H1 blocker, and H2 blocker were ordered.  Patient was monitored in the ED. He continued to have resolution of his shortness of breath.  Urticaria resolved.  On reexam, previous areas of rash showed improvement.  After 3 hours of observation in the ED, patient was discharged in good condition. I ordered medication including IV fluids, Solu-Medrol, Benadryl, Pepcid for allergic reaction Reevaluation of the patient after these medicines showed that the patient improved I have reviewed the patients home medicines and have made adjustments as needed   Social Determinants of Health:  Has PCP        Final Clinical Impression(s) / ED Diagnoses Final diagnoses:  Allergic reaction, initial encounter    Rx / DC Orders ED Discharge Orders          Ordered    predniSONE (DELTASONE) 10 MG tablet  Daily        01/04/23 0424    diphenhydrAMINE (BENADRYL) 25 MG tablet  Every 6 hours PRN        01/04/23 0424    famotidine (PEPCID) 20 MG tablet  2 times  daily        01/04/23 0424              Gloris Manchester, MD 01/04/23 [phone]

## 2023-03-08 DIAGNOSIS — F1120 Opioid dependence, uncomplicated: Secondary | ICD-10-CM | POA: Diagnosis not present

## 2023-03-08 DIAGNOSIS — M4802 Spinal stenosis, cervical region: Secondary | ICD-10-CM | POA: Diagnosis not present

## 2023-03-08 DIAGNOSIS — M51362 Other intervertebral disc degeneration, lumbar region with discogenic back pain and lower extremity pain: Secondary | ICD-10-CM | POA: Diagnosis not present

## 2023-05-17 DIAGNOSIS — L814 Other melanin hyperpigmentation: Secondary | ICD-10-CM | POA: Diagnosis not present

## 2023-05-17 DIAGNOSIS — D2271 Melanocytic nevi of right lower limb, including hip: Secondary | ICD-10-CM | POA: Diagnosis not present

## 2023-05-17 DIAGNOSIS — L821 Other seborrheic keratosis: Secondary | ICD-10-CM | POA: Diagnosis not present

## 2023-05-17 DIAGNOSIS — D2261 Melanocytic nevi of right upper limb, including shoulder: Secondary | ICD-10-CM | POA: Diagnosis not present

## 2023-05-17 DIAGNOSIS — D2262 Melanocytic nevi of left upper limb, including shoulder: Secondary | ICD-10-CM | POA: Diagnosis not present

## 2023-05-17 DIAGNOSIS — D225 Melanocytic nevi of trunk: Secondary | ICD-10-CM | POA: Diagnosis not present

## 2023-05-17 DIAGNOSIS — L565 Disseminated superficial actinic porokeratosis (DSAP): Secondary | ICD-10-CM | POA: Diagnosis not present

## 2023-06-07 DIAGNOSIS — M51362 Other intervertebral disc degeneration, lumbar region with discogenic back pain and lower extremity pain: Secondary | ICD-10-CM | POA: Diagnosis not present

## 2023-06-07 DIAGNOSIS — F1120 Opioid dependence, uncomplicated: Secondary | ICD-10-CM | POA: Diagnosis not present

## 2023-06-07 DIAGNOSIS — M4802 Spinal stenosis, cervical region: Secondary | ICD-10-CM | POA: Diagnosis not present

## 2023-08-29 DIAGNOSIS — E782 Mixed hyperlipidemia: Secondary | ICD-10-CM | POA: Diagnosis not present

## 2023-08-29 DIAGNOSIS — M51362 Other intervertebral disc degeneration, lumbar region with discogenic back pain and lower extremity pain: Secondary | ICD-10-CM | POA: Diagnosis not present

## 2023-08-29 DIAGNOSIS — I700 Atherosclerosis of aorta: Secondary | ICD-10-CM | POA: Diagnosis not present

## 2023-08-29 DIAGNOSIS — Z125 Encounter for screening for malignant neoplasm of prostate: Secondary | ICD-10-CM | POA: Diagnosis not present

## 2023-08-29 DIAGNOSIS — M109 Gout, unspecified: Secondary | ICD-10-CM | POA: Diagnosis not present

## 2023-08-29 DIAGNOSIS — J439 Emphysema, unspecified: Secondary | ICD-10-CM | POA: Diagnosis not present

## 2023-08-29 DIAGNOSIS — E039 Hypothyroidism, unspecified: Secondary | ICD-10-CM | POA: Diagnosis not present

## 2023-08-29 DIAGNOSIS — Z1159 Encounter for screening for other viral diseases: Secondary | ICD-10-CM | POA: Diagnosis not present

## 2023-08-29 DIAGNOSIS — Z0000 Encounter for general adult medical examination without abnormal findings: Secondary | ICD-10-CM | POA: Diagnosis not present

## 2023-08-29 DIAGNOSIS — F1120 Opioid dependence, uncomplicated: Secondary | ICD-10-CM | POA: Diagnosis not present

## 2023-08-29 DIAGNOSIS — M4802 Spinal stenosis, cervical region: Secondary | ICD-10-CM | POA: Diagnosis not present

## 2023-08-29 DIAGNOSIS — R7303 Prediabetes: Secondary | ICD-10-CM | POA: Diagnosis not present

## 2023-08-29 DIAGNOSIS — N401 Enlarged prostate with lower urinary tract symptoms: Secondary | ICD-10-CM | POA: Diagnosis not present

## 2023-08-29 DIAGNOSIS — I10 Essential (primary) hypertension: Secondary | ICD-10-CM | POA: Diagnosis not present

## 2023-08-29 DIAGNOSIS — I6529 Occlusion and stenosis of unspecified carotid artery: Secondary | ICD-10-CM | POA: Diagnosis not present

## 2023-09-28 ENCOUNTER — Other Ambulatory Visit: Payer: Self-pay

## 2023-09-28 ENCOUNTER — Telehealth: Payer: Self-pay | Admitting: Acute Care

## 2023-09-28 DIAGNOSIS — Z87891 Personal history of nicotine dependence: Secondary | ICD-10-CM

## 2023-09-28 DIAGNOSIS — Z122 Encounter for screening for malignant neoplasm of respiratory organs: Secondary | ICD-10-CM

## 2023-09-28 NOTE — Telephone Encounter (Signed)
 Lung Cancer Screening Narrative/Criteria Questionnaire (Cigarette Smokers Only- No Cigars/Pipes/vapes)   Nathaniel Rojas   SDMV:10/03/23 at 3pm/Kristen                                           Apr 01, 1955               LDCT: 10/24/23 at 0830am/[address]    68 y.o.   Phone: [phone]  Lung Screening Narrative (confirm age 58-77 yrs Medicare / 50-80 yrs Private pay insurance)   Insurance information:[employer]   Referring Provider:Swayne   This screening involves an initial phone call with a team member from our program. It is called a shared decision making visit. The initial meeting is required by insurance and Medicare to make sure you understand the program. This appointment takes about 15-20 minutes to complete. The CT scan will completed at a separate date/time. This scan takes about 5-10 minutes to complete and you may eat and drink before and after the scan.  Criteria questions for Lung Cancer Screening:   Are you a current or former smoker? Former Age began smoking: 68 yo   If you are a former smoker, what year did you quit smoking? 2011   To calculate your smoking history, I need an accurate estimate of how many packs of cigarettes you smoked per day and for how many years. (Not just the number of PPD you are now smoking)   Years smoking 35 x Packs per day 1 = Pack years 35   (at least 20 pack yrs)   (Make sure they understand that we need to know how much they have smoked in the past, not just the number of PPD they are smoking now)  Do you have a personal history of cancer?  No    Do you have a family history of cancer? Yes  (cancer type and and relative) mother/breast  Are you coughing up blood?  No  Have you had unexplained weight loss of 15 lbs or more in the last 6 months? No  It looks like you meet all criteria.     Additional information: N/A

## 2023-09-29 DIAGNOSIS — E782 Mixed hyperlipidemia: Secondary | ICD-10-CM | POA: Diagnosis not present

## 2023-10-03 ENCOUNTER — Ambulatory Visit: Admitting: *Deleted

## 2023-10-03 ENCOUNTER — Encounter: Payer: Self-pay | Admitting: *Deleted

## 2023-10-03 DIAGNOSIS — Z87891 Personal history of nicotine dependence: Secondary | ICD-10-CM | POA: Diagnosis not present

## 2023-10-03 NOTE — Patient Instructions (Signed)

## 2023-10-03 NOTE — Progress Notes (Signed)
  Virtual Visit via Telephone Note  I connected with Nathaniel Rojas on 10/03/23 at  3:00 PM EDT by telephone and verified that I am speaking with the correct person using two identifiers.  Location: Patient: in home Provider: [address], [place], [place], [address]    Shared Decision Making Visit Lung Cancer Screening Program [id number])   Eligibility: Age 68 y.o. Pack Years Smoking History Calculation 35 (# packs/per year x # years smoked) Recent History of coughing up blood  no Unexplained weight loss? no ( >Than 15 pounds within the last 6 months ) Prior History Lung / other cancer no (Diagnosis within the last 5 years already requiring surveillance chest CT Scans). Smoking Status Former Smoker Former Smokers: Years since quit: 14 years  Quit Date: 2011  Visit Components: Discussion included one or more decision making aids. yes Discussion included risk/benefits of screening. yes Discussion included potential follow up diagnostic testing for abnormal scans. yes Discussion included meaning and risk of over diagnosis. yes Discussion included meaning and risk of False Positives. yes Discussion included meaning of total radiation exposure. yes  Counseling Included: Importance of adherence to annual lung cancer LDCT screening. yes Impact of comorbidities on ability to participate in the program. yes Ability and willingness to under diagnostic treatment. yes  Smoking Cessation Counseling: Current Smokers:  Discussed importance of smoking cessation. yes Information about tobacco cessation classes and interventions provided to patient. yes Patient provided with ticket for LDCT Scan. yes Symptomatic Patient. no  Counseling NA Diagnosis Code: Tobacco Use Z72.0 Asymptomatic Patient yes  Counseling (Intermediate counseling: > three minutes counseling) [id number] Former Smokers:  Discussed the importance of maintaining cigarette abstinence. yes Diagnosis Code: Personal  History of Nicotine Dependence. S12.108 Information about tobacco cessation classes and interventions provided to patient. Yes Patient provided with ticket for LDCT Scan. yes Written Order for Lung Cancer Screening with LDCT placed in Epic. Yes (CT Chest Lung Cancer Screening Low Dose W/O CM) [id number] Z12.2-Screening of respiratory organs Z87.891-Personal history of nicotine dependence   Josette Ranger, RN 10/03/23

## 2023-10-06 DIAGNOSIS — Z136 Encounter for screening for cardiovascular disorders: Secondary | ICD-10-CM | POA: Diagnosis not present

## 2023-10-06 DIAGNOSIS — Z87891 Personal history of nicotine dependence: Secondary | ICD-10-CM | POA: Diagnosis not present

## 2023-10-24 ENCOUNTER — Ambulatory Visit
Admission: RE | Admit: 2023-10-24 | Discharge: 2023-10-24 | Disposition: A | Discharge status: Home / Self Care | Source: Ambulatory Visit | Attending: Acute Care | Admitting: Acute Care

## 2023-10-24 DIAGNOSIS — Z122 Encounter for screening for malignant neoplasm of respiratory organs: Secondary | ICD-10-CM

## 2023-10-24 DIAGNOSIS — Z87891 Personal history of nicotine dependence: Secondary | ICD-10-CM | POA: Diagnosis not present

## 2023-11-03 ENCOUNTER — Other Ambulatory Visit: Payer: Self-pay

## 2023-11-03 DIAGNOSIS — Z122 Encounter for screening for malignant neoplasm of respiratory organs: Secondary | ICD-10-CM

## 2023-11-03 DIAGNOSIS — Z87891 Personal history of nicotine dependence: Secondary | ICD-10-CM

## 2023-12-05 DIAGNOSIS — M51362 Other intervertebral disc degeneration, lumbar region with discogenic back pain and lower extremity pain: Secondary | ICD-10-CM | POA: Diagnosis not present

## 2023-12-05 DIAGNOSIS — F1120 Opioid dependence, uncomplicated: Secondary | ICD-10-CM | POA: Diagnosis not present
